# Patient Record
Sex: Male | Born: 1990 | Race: White | Hispanic: No | Marital: Married | Smoking: Former smoker
Health system: Southern US, Community
[De-identification: ages and names within clinical notes are randomized; demographics above are authoritative.]

## PROBLEM LIST (undated history)

## (undated) DIAGNOSIS — I499 Cardiac arrhythmia, unspecified: Secondary | ICD-10-CM

## (undated) DIAGNOSIS — R112 Nausea with vomiting, unspecified: Secondary | ICD-10-CM

## (undated) DIAGNOSIS — R55 Syncope and collapse: Secondary | ICD-10-CM

## (undated) DIAGNOSIS — K219 Gastro-esophageal reflux disease without esophagitis: Secondary | ICD-10-CM

## (undated) DIAGNOSIS — F32A Depression, unspecified: Secondary | ICD-10-CM

## (undated) DIAGNOSIS — F1911 Other psychoactive substance abuse, in remission: Secondary | ICD-10-CM

## (undated) HISTORY — PX: WISDOM TOOTH EXTRACTION: SHX21

## (undated) HISTORY — DX: Depression, unspecified: F32.A

## (undated) HISTORY — DX: Other psychoactive substance abuse, in remission: F19.11

## (undated) HISTORY — DX: Gastro-esophageal reflux disease without esophagitis: K21.9

## (undated) HISTORY — DX: Syncope and collapse: R55

## (undated) HISTORY — DX: Nausea with vomiting, unspecified: R11.2

---
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
## 2013-08-12 ENCOUNTER — Ambulatory Visit (INDEPENDENT_AMBULATORY_CARE_PROVIDER_SITE_OTHER): Payer: Medicaid Other | Admitting: General Surgery

## 2014-08-12 ENCOUNTER — Encounter (HOSPITAL_COMMUNITY): Payer: Self-pay | Admitting: Emergency Medicine

## 2014-08-12 ENCOUNTER — Emergency Department (HOSPITAL_COMMUNITY)
Admission: EM | Admit: 2014-08-12 | Discharge: 2014-08-12 | Disposition: A | Discharge status: Home / Self Care | Payer: BC Managed Care – PPO | Attending: Emergency Medicine | Admitting: Emergency Medicine

## 2014-08-12 ENCOUNTER — Emergency Department (HOSPITAL_COMMUNITY): Payer: BC Managed Care – PPO

## 2014-08-12 DIAGNOSIS — R1110 Vomiting, unspecified: Secondary | ICD-10-CM | POA: Diagnosis not present

## 2014-08-12 DIAGNOSIS — R0789 Other chest pain: Secondary | ICD-10-CM

## 2014-08-12 DIAGNOSIS — R079 Chest pain, unspecified: Secondary | ICD-10-CM

## 2014-08-12 LAB — BASIC METABOLIC PANEL
ANION GAP: 15 (ref 5–15)
BUN: 15 mg/dL (ref 6–23)
CHLORIDE: 101 meq/L (ref 96–112)
CO2: 23 meq/L (ref 19–32)
CREATININE: 0.8 mg/dL (ref 0.50–1.35)
Calcium: 9.9 mg/dL (ref 8.4–10.5)
GFR calc non Af Amer: >90 mL/min (ref >90)
Glucose, Bld: 114 mg/dL — ABNORMAL HIGH (ref 70–99)
POTASSIUM: 3.6 meq/L — AB (ref 3.7–5.3)
SODIUM: 139 meq/L (ref 137–147)

## 2014-08-12 LAB — I-STAT TROPONIN, ED: TROPONIN I, POC: 0 ng/mL (ref 0.00–0.08)

## 2014-08-12 LAB — CBC WITH DIFFERENTIAL/PLATELET
BASOS PCT: 0 % (ref 0–1)
Basophils Absolute: 0 10*3/uL (ref 0.0–0.1)
EOS ABS: 0.2 10*3/uL (ref 0.0–0.7)
Eosinophils Relative: 2 % (ref 0–5)
HEMATOCRIT: 46.2 % (ref 39.0–52.0)
HEMOGLOBIN: 15.9 g/dL (ref 13.0–17.0)
LYMPHS ABS: 2.9 10*3/uL (ref 0.7–4.0)
Lymphocytes Relative: 37 % (ref 12–46)
MCH: 29.3 pg (ref 26.0–34.0)
MCHC: 34.4 g/dL (ref 30.0–36.0)
MCV: 85.1 fL (ref 78.0–100.0)
MONO ABS: 0.6 10*3/uL (ref 0.1–1.0)
MONOS PCT: 8 % (ref 3–12)
NEUTROS ABS: 4.2 10*3/uL (ref 1.7–7.7)
Neutrophils Relative %: 53 % (ref 43–77)
Platelets: 213 10*3/uL (ref 150–400)
RBC: 5.43 MIL/uL (ref 4.22–5.81)
RDW: 12.8 % (ref 11.5–15.5)
WBC: 7.8 10*3/uL (ref 4.0–10.5)

## 2014-08-12 MED ORDER — OMEPRAZOLE 40 MG PO CPDR: 40.0000 mg | DELAYED_RELEASE_CAPSULE | Freq: Every day | ORAL | Status: DC

## 2014-08-12 MED ORDER — PROMETHAZINE HCL 25 MG PO TABS: 25.0000 mg | ORAL_TABLET | Freq: Four times a day (QID) | ORAL | Status: DC | PRN

## 2014-08-12 MED ORDER — GI COCKTAIL ~~LOC~~
30.0000 mL | Freq: Once | ORAL | Status: AC
  Administered 2014-08-12: 30 mL via ORAL
  Filled 2014-08-12: qty 30

## 2014-08-12 NOTE — ED Notes (Signed)
Pt c/o generalized CP and N/V with some dark blood per pt starting today

## 2014-08-12 NOTE — ED Provider Notes (Signed)
CSN: [id number]     Arrival date & time 08/12/14  1309 History   First MD Initiated Contact with Patient 08/12/14 1631     Chief Complaint  Patient presents with  . Chest Pain  . Emesis      Patient is a 23 y.o. male presenting with chest pain and vomiting. The history is provided by the patient.  Chest Pain Associated symptoms: vomiting   Emesis  Mr. Gordy LevanWalton presents for evaluation of chest pain and vomiting. In terms of chest pain, this is been a constant pain that tingling on for the last 1-1/2-2 months. It is central and right-sided chest discomfort described as pressure-like. There is no change with activities, movement, eating or drinking. Denies any fevers, cough, shortness of breath, leg pain or leg swelling. He also describes vomiting that started at 7:30 this morning. He's had about 10 episodes of emesis that have been brown and follow me with sometimes red and he states there is some blood in his vomit. The abdominal pain, diarrhea, black or bloody stools. He does report that he's had intermittent nosebleeds over the last couple weeks, the last one was a few days ago. He takes BC powders and Goody powders over-the-counter occasionally he denies any other medications. Symptoms are moderate in nature.   History reviewed. No pertinent past medical history. History reviewed. No pertinent past surgical history. History reviewed. No pertinent family history. History  Substance Use Topics  . Smoking status: Never Smoker   . Smokeless tobacco: Not on file  . Alcohol Use: No    Review of Systems  Cardiovascular: Positive for chest pain.  Gastrointestinal: Positive for vomiting.  All other systems reviewed and are negative.     Allergies  Review of patient's allergies indicates no known allergies.  Home Medications   Prior to Admission medications   Not on File   BP 121/68 mmHg  Pulse 66  Temp(Src) 97.5 F (36.4 C) (Oral)  Resp 13  Ht 5\' 7"  (1.702 m)  Wt 138 lb  (62.596 kg)  BMI 21.61 kg/m2  SpO2 99% Physical Exam  Constitutional: He is oriented to person, place, and time. He appears well-developed and well-nourished.  HENT:  Head: Normocephalic and atraumatic.  Nose: Nose normal.  Mouth/Throat: Oropharynx is clear and moist.  Cardiovascular: Normal rate.   No murmur heard. Pulmonary/Chest: Effort normal. No respiratory distress.  Abdominal: Soft. There is no tenderness. There is no rebound and no guarding.  Musculoskeletal: He exhibits no edema or tenderness.  Neurological: He is alert and oriented to person, place, and time.  Skin: Skin is warm and dry.  Psychiatric: He has a normal mood and affect. His behavior is normal.  Nursing note and vitals reviewed.   ED Course  Procedures (including critical care time) Labs Review Labs Reviewed  BASIC METABOLIC PANEL - Abnormal; Notable for the following:    Potassium 3.6 (*)    Glucose, Bld 114 (*)    All other components within normal limits  CBC WITH DIFFERENTIAL  Rosezena SensorI-STAT TROPOININ, ED    Imaging Review Dg Chest 2 View  08/12/2014   CLINICAL DATA:  Generalized chest pain, nausea and vomiting  EXAM: CHEST  2 VIEW  COMPARISON:  None.  FINDINGS: The heart size and mediastinal contours are within normal limits. Both lungs are clear. The visualized skeletal structures are unremarkable.  IMPRESSION: No active cardiopulmonary disease.   Electronically Signed   By: Natasha MeadLiviu  Pop M.D.   On: 08/12/2014 15:36  EKG Interpretation   Date/Time:  Wednesday August 12 2014 13:13:08 EST Ventricular Rate:  75 PR Interval:  158 QRS Duration: 100 QT Interval:  384 QTC Calculation: 428 R Axis:   88 Text Interpretation:  Normal sinus rhythm Normal ECG Confirmed by Lincoln Brighamees,  Liz [phone]([id number]) on 08/12/2014 4:32:03 PM      MDM   Final diagnoses:  Vomiting in adult  Atypical chest pain    Mr. Gordy LevanWalton presents for evaluation of vomiting and chest pain.  In terms of chest pain this is atypical pain in  nature, clinical picture is not consistent with ACS, PE, pneumonia, dissection. This patient home care and PCP follow-up regarding chest pain. In terms of vomiting blood, patient is hemodynamically stable in the emergency department and has noted the recurrent vomiting in the department. There is a question if this blood is coming from epistaxis versus GI source. There is no current evidence of bleeding in the emergency department. Discussed with patient stopping BC powders and Goody powders and starting on PPI. Discussed with patient PCP follow-up, return precautions.  Tilden FossaElizabeth Daylan Juhnke, MD 08/12/14 [id number]

## 2014-08-12 NOTE — Discharge Instructions (Signed)
Hematemesis This condition is the vomiting of blood. CAUSES  This can happen if you have a peptic ulcer or an irritation of the throat, stomach, or small bowel. Vomiting over and over again or swallowing blood from a nosebleed, coughing or facial injury can also result in bloody vomit. Anti-inflammatory pain medicines are a common cause of this potentially dangerous condition. The most serious causes of vomiting blood include:  Ulcers (a bacteria called H. pylori is common cause of ulcers).  Clotting problems.  Alcoholism.  Cirrhosis. TREATMENT  Treatment depends on the cause and the severity of the bleeding. Small amounts of blood streaks in the vomit is not the same as vomiting large amounts of bloody or dark, coffee grounds-like material. Weakness, fainting, dehydration, anemia, and continued alcohol or drug use increase the risk. Examination may include blood, vomit, or stool tests. The presence of bloody or dark stool that tests positive for blood (Hemoccult) means the bleeding has been going on for some time. Endoscopy and imaging studies may be done. Emergency treatment may include:  IV medicines or fluids.  Blood transfusions.  Surgery. Hospital care is required for high risk patients or when IV fluids or blood is needed. Upper GI bleeding can cause shock and death if not controlled. HOME CARE INSTRUCTIONS   Your treatment does not require hospital care at this time.  Remain at rest until your condition improves.  Drink clear liquids as tolerated.  Avoid:  Alcohol.  Nicotine.  Aspirin.  Any other anti-inflammatory medicine (ibuprofen, naproxen, and many others).  Medications to suppress stomach acid or vomiting may be needed. Take all your medicine as prescribed.  Be sure to see your caregiver for follow-up as recommended. SEEK IMMEDIATE MEDICAL CARE IF:   You have repeated vomiting, dehydration, fainting, or extreme weakness.  You are vomiting large amounts of  bloody or dark material.  You pass large, dark or bloody stools. Document Released: 09/21/2004 Document Revised: 11/06/2011 Document Reviewed: 10/07/2008 [facility] Patient Information 2015 [place], [place]. This information is not intended to replace advice given to you by your health care provider. Make sure you discuss any questions you have with your health care provider.  Chest Pain (Nonspecific) It is often hard to give a specific diagnosis for the cause of chest pain. There is always a chance that your pain could be related to something serious, such as a heart attack or a blood clot in the lungs. You need to follow up with your health care provider for further evaluation. CAUSES   Heartburn.  Pneumonia or bronchitis.  Anxiety or stress.  Inflammation around your heart (pericarditis) or lung (pleuritis or pleurisy).  A blood clot in the lung.  A collapsed lung (pneumothorax). It can develop suddenly on its own (spontaneous pneumothorax) or from trauma to the chest.  Shingles infection (herpes zoster virus). The chest wall is composed of bones, muscles, and cartilage. Any of these can be the source of the pain.  The bones can be bruised by injury.  The muscles or cartilage can be strained by coughing or overwork.  The cartilage can be affected by inflammation and become sore (costochondritis). DIAGNOSIS  Lab tests or other studies may be needed to find the cause of your pain. Your health care provider may have you take a test called an ambulatory electrocardiogram (ECG). An ECG records your heartbeat patterns over a 24-hour period. You may also have other tests, such as:  Transthoracic echocardiogram (TTE). During echocardiography, sound waves are used to evaluate  how blood flows through your heart.  Transesophageal echocardiogram (TEE).  Cardiac monitoring. This allows your health care provider to monitor your heart rate and rhythm in real time.  Holter monitor. This is a  portable device that records your heartbeat and can help diagnose heart arrhythmias. It allows your health care provider to track your heart activity for several days, if needed.  Stress tests by exercise or by giving medicine that makes the heart beat faster. TREATMENT   Treatment depends on what may be causing your chest pain. Treatment may include:  Acid blockers for heartburn.  Anti-inflammatory medicine.  Pain medicine for inflammatory conditions.  Antibiotics if an infection is present.  You may be advised to change lifestyle habits. This includes stopping smoking and avoiding alcohol, caffeine, and chocolate.  You may be advised to keep your head raised (elevated) when sleeping. This reduces the chance of acid going backward from your stomach into your esophagus. Most of the time, nonspecific chest pain will improve within 2-3 days with rest and mild pain medicine.  HOME CARE INSTRUCTIONS   If antibiotics were prescribed, take them as directed. Finish them even if you start to feel better.  For the next few days, avoid physical activities that bring on chest pain. Continue physical activities as directed.  Do not use any tobacco products, including cigarettes, chewing tobacco, or electronic cigarettes.  Avoid drinking alcohol.  Only take medicine as directed by your health care provider.  Follow your health care provider's suggestions for further testing if your chest pain does not go away.  Keep any follow-up appointments you made. If you do not go to an appointment, you could develop lasting (chronic) problems with pain. If there is any problem keeping an appointment, call to reschedule. SEEK MEDICAL CARE IF:   Your chest pain does not go away, even after treatment.  You have a rash with blisters on your chest.  You have a fever. SEEK IMMEDIATE MEDICAL CARE IF:   You have increased chest pain or pain that spreads to your arm, neck, jaw, back, or abdomen.  You  have shortness of breath.  You have an increasing cough, or you cough up blood.  You have severe back or abdominal pain.  You feel nauseous or vomit.  You have severe weakness.  You faint.  You have chills. This is an emergency. Do not wait to see if the pain will go away. Get medical help at once. Call your local emergency services (911 in [place] Do not drive yourself to the hospital. MAKE SURE YOU:   Understand these instructions.  Will watch your condition.  Will get help right away if you are not doing well or get worse. Document Released: 05/24/2005 Document Revised: 08/19/2013 Document Reviewed: 03/19/2008 [facility] Patient Information 2015 [place], [place]. This information is not intended to replace advice given to you by your health care provider. Make sure you discuss any questions you have with your health care provider.

## 2014-09-07 ENCOUNTER — Ambulatory Visit (INDEPENDENT_AMBULATORY_CARE_PROVIDER_SITE_OTHER): Payer: BLUE CROSS/BLUE SHIELD | Admitting: Physician Assistant

## 2014-09-07 ENCOUNTER — Encounter: Payer: Self-pay | Admitting: Physician Assistant

## 2014-09-07 ENCOUNTER — Ambulatory Visit (HOSPITAL_BASED_OUTPATIENT_CLINIC_OR_DEPARTMENT_OTHER)
Admission: RE | Admit: 2014-09-07 | Discharge: 2014-09-07 | Disposition: A | Discharge status: Home / Self Care | Payer: BLUE CROSS/BLUE SHIELD | Source: Ambulatory Visit | Attending: Physician Assistant | Admitting: Physician Assistant

## 2014-09-07 ENCOUNTER — Other Ambulatory Visit: Payer: Self-pay | Admitting: Physician Assistant

## 2014-09-07 VITALS — BP 130/80 | HR 64 | Temp 97.8°F | Resp 16 | Ht 67.0 in | Wt 138.2 lb

## 2014-09-07 DIAGNOSIS — R1011 Right upper quadrant pain: Secondary | ICD-10-CM | POA: Insufficient documentation

## 2014-09-07 DIAGNOSIS — R112 Nausea with vomiting, unspecified: Secondary | ICD-10-CM | POA: Insufficient documentation

## 2014-09-07 DIAGNOSIS — K2970 Gastritis, unspecified, without bleeding: Secondary | ICD-10-CM

## 2014-09-07 DIAGNOSIS — R1013 Epigastric pain: Secondary | ICD-10-CM | POA: Diagnosis present

## 2014-09-07 HISTORY — DX: Gastritis, unspecified, without bleeding: K29.70

## 2014-09-07 LAB — CBC
HEMATOCRIT: 48.9 % (ref 39.0–52.0)
HEMOGLOBIN: 16.4 g/dL (ref 13.0–17.0)
MCHC: 33.5 g/dL (ref 30.0–36.0)
MCV: 87.6 fl (ref 78.0–100.0)
PLATELETS: 180 10*3/uL (ref 150.0–400.0)
RBC: 5.59 Mil/uL (ref 4.22–5.81)
RDW: 13.1 % (ref 11.5–15.5)
WBC: 8.1 10*3/uL (ref 4.0–10.5)

## 2014-09-07 LAB — COMPREHENSIVE METABOLIC PANEL
ALK PHOS: 71 U/L (ref 39–117)
ALT: 19 U/L (ref 0–53)
AST: 19 U/L (ref 0–37)
Albumin: 4.9 g/dL (ref 3.5–5.2)
BILIRUBIN TOTAL: 0.6 mg/dL (ref 0.2–1.2)
BUN: 16 mg/dL (ref 6–23)
CALCIUM: 9.7 mg/dL (ref 8.4–10.5)
CO2: 25 mEq/L (ref 19–32)
Chloride: 105 mEq/L (ref 96–112)
Creatinine, Ser: 1 mg/dL (ref 0.4–1.5)
GFR: 101.77 mL/min (ref >60.00)
Glucose, Bld: 98 mg/dL (ref 70–99)
Potassium: 4.2 mEq/L (ref 3.5–5.1)
SODIUM: 138 meq/L (ref 135–145)
TOTAL PROTEIN: 7.7 g/dL (ref 6.0–8.3)

## 2014-09-07 LAB — LIPASE: Lipase: 31 U/L (ref 11.0–59.0)

## 2014-09-07 LAB — H. PYLORI ANTIBODY, IGG: H PYLORI IGG: NEGATIVE

## 2014-09-07 MED ORDER — SUCRALFATE 1 GM/10ML PO SUSP: 1.0000 g | Freq: Three times a day (TID) | ORAL | Status: DC

## 2014-09-07 MED ORDER — ESOMEPRAZOLE MAGNESIUM 20 MG PO CPDR: 20.0000 mg | DELAYED_RELEASE_CAPSULE | Freq: Two times a day (BID) | ORAL | Status: DC

## 2014-09-07 NOTE — Assessment & Plan Note (Signed)
US and labs unremarkable.  Gastritis currently treated. Referral placed to GI for further assessment.

## 2014-09-07 NOTE — Progress Notes (Signed)
Patient presents to clinic today to establish care.  Patient complains of intermittent nausea and vomiting associated with bilateral upper quadrant abdominal pain. Was seen in ER and placed on Prilosec daily without improvement in symptoms.  EKG, CXR and labs looked good at that time. Only CBC and BMP checked. Patient states he wen to an urgent care last week and was told his symptoms may be stemming from gallbladder issues. Was told to stop Prilosec and begin Zantac 150 mg daily.  Denies improvement in symptoms.  Patient endorses bilious emesis, having a few episodes per week.  Denies dysphagia, chest pain or shortness of breath.  Pain is not affected by eating.  Pain is constant with occasional exacerbation.  Endorses regular stools.  Denies tenesmus, melena or hematochezia.  Does have remote history of BC powder use but has stopped for > 4 weeks.  Denies alcohol consumption.  Endorses family history of gallbladder problems.  Has not taken anything for nausea.   Past Medical History  Diagnosis Date  . GERD (gastroesophageal reflux disease)   . Nausea & vomiting     Past Surgical History  Procedure Laterality Date  . Wisdom tooth extraction      Current Outpatient Prescriptions on File Prior to Visit  Medication Sig Dispense Refill  . promethazine (PHENERGAN) 25 MG tablet Take 1 tablet (25 mg total) by mouth every 6 (six) hours as needed for nausea or vomiting. 8 tablet 0   No current facility-administered medications on file prior to visit.    Allergies  Allergen Reactions  . Doxycycline Diarrhea, Nausea And Vomiting and Rash    Family History  Problem Relation Age of Onset  . Healthy Mother     Living  . Healthy Mother     Living  . Cancer Maternal Grandfather   . COPD Maternal Grandfather   . Heart disease Maternal Grandfather   . Parkinson's disease Maternal Grandmother   . Gallbladder disease Other     Maternal Great Uncle-Cancer  . Arthritis Maternal Uncle     Hip  Replacement  . Diabetes Sister     History   Social History  . Marital Status: Single    Spouse Name: N/A    Number of Children: N/A  . Years of Education: N/A   Occupational History  . Not on file.   Social History Main Topics  . Smoking status: Never Smoker   . Smokeless tobacco: Not on file  . Alcohol Use: No  . Drug Use: No  . Sexual Activity: Not on file   Other Topics Concern  . Not on file   Social History Narrative   ROS Pertinent ROS are listed in HPI.  BP 130/80 mmHg  Pulse 64  Temp(Src) 97.8 F (36.6 C) (Oral)  Resp 16  Ht 5' 7"  (1.702 m)  Wt 138 lb 4 oz (62.71 kg)  BMI 21.65 kg/m2  SpO2 99%  Physical Exam  Constitutional: He is oriented to person, place, and time and well-developed, well-nourished, and in no distress.  HENT:  Head: Normocephalic and atraumatic.  Eyes: Conjunctivae are normal. Pupils are equal, round, and reactive to light.  Neck: Neck supple.  Cardiovascular: Normal rate, regular rhythm, normal heart sounds and intact distal pulses.   Pulmonary/Chest: Effort normal and breath sounds normal. No respiratory distress. He has no wheezes. He has no rales. He exhibits no tenderness.  Abdominal: Normal appearance and bowel sounds are normal. There is no hepatosplenomegaly. There is tenderness in the right  upper quadrant, epigastric area and left upper quadrant. There is no rebound, no guarding, no CVA tenderness and negative Murphy's sign. No hernia.  Neurological: He is alert and oriented to person, place, and time.  Skin: Skin is warm and dry. No rash noted.  Vitals reviewed.   Recent Results (from the past 2160 hour(s))  Basic metabolic panel     Status: Abnormal   Collection Time: 08/12/14  1:25 PM  Result Value Ref Range   Sodium 139 137 - 147 mEq/L   Potassium 3.6 (L) 3.7 - 5.3 mEq/L   Chloride 101 96 - 112 mEq/L   CO2 23 19 - 32 mEq/L   Glucose, Bld 114 (H) 70 - 99 mg/dL   BUN 15 6 - 23 mg/dL   Creatinine, Ser 0.80 0.50 -  1.35 mg/dL   Calcium 9.9 8.4 - 10.5 mg/dL   GFR calc non Af Amer >90 >90 mL/min   GFR calc Af Amer >90 >90 mL/min    Comment: (NOTE) The eGFR has been calculated using the CKD EPI equation. This calculation has not been validated in all clinical situations. eGFR's persistently <90 mL/min signify possible Chronic Kidney Disease.    Anion gap 15 5 - 15  CBC with Differential     Status: None   Collection Time: 08/12/14  1:25 PM  Result Value Ref Range   WBC 7.8 4.0 - 10.5 K/uL   RBC 5.43 4.22 - 5.81 MIL/uL   Hemoglobin 15.9 13.0 - 17.0 g/dL   HCT 46.2 39.0 - 52.0 %   MCV 85.1 78.0 - 100.0 fL   MCH 29.3 26.0 - 34.0 pg   MCHC 34.4 30.0 - 36.0 g/dL   RDW 12.8 11.5 - 15.5 %   Platelets 213 150 - 400 K/uL   Neutrophils Relative % 53 43 - 77 %   Neutro Abs 4.2 1.7 - 7.7 K/uL   Lymphocytes Relative 37 12 - 46 %   Lymphs Abs 2.9 0.7 - 4.0 K/uL   Monocytes Relative 8 3 - 12 %   Monocytes Absolute 0.6 0.1 - 1.0 K/uL   Eosinophils Relative 2 0 - 5 %   Eosinophils Absolute 0.2 0.0 - 0.7 K/uL   Basophils Relative 0 0 - 1 %   Basophils Absolute 0.0 0.0 - 0.1 K/uL  I-stat troponin, ED (not at [facility])     Status: None   Collection Time: 08/12/14  1:33 PM  Result Value Ref Range   Troponin i, poc 0.00 0.00 - 0.08 ng/mL   Comment 3            Comment: Due to the release kinetics of cTnI, a negative result within the first hours of the onset of symptoms does not rule out myocardial infarction with certainty. If myocardial infarction is still suspected, repeat the test at appropriate intervals.   CBC     Status: None   Collection Time: 09/07/14  8:51 AM  Result Value Ref Range   WBC 8.1 4.0 - 10.5 K/uL   RBC 5.59 4.22 - 5.81 Mil/uL   Platelets 180.0 150.0 - 400.0 K/uL   Hemoglobin 16.4 13.0 - 17.0 g/dL   HCT 48.9 39.0 - 52.0 %   MCV 87.6 78.0 - 100.0 fl   MCHC 33.5 30.0 - 36.0 g/dL   RDW 13.1 11.5 - 15.5 %  Comp Met (CMET)     Status: None   Collection Time: 09/07/14  8:51 AM  Result  Value Ref Range  Sodium 138 135 - 145 mEq/L   Potassium 4.2 3.5 - 5.1 mEq/L   Chloride 105 96 - 112 mEq/L   CO2 25 19 - 32 mEq/L   Glucose, Bld 98 70 - 99 mg/dL   BUN 16 6 - 23 mg/dL   Creatinine, Ser 1.0 0.4 - 1.5 mg/dL   Total Bilirubin 0.6 0.2 - 1.2 mg/dL   Alkaline Phosphatase 71 39 - 117 U/L   AST 19 0 - 37 U/L   ALT 19 0 - 53 U/L   Total Protein 7.7 6.0 - 8.3 g/dL   Albumin 4.9 3.5 - 5.2 g/dL   Calcium 9.7 8.4 - 10.5 mg/dL   GFR 101.77 >60.00 mL/min  Lipase     Status: None   Collection Time: 09/07/14  8:51 AM  Result Value Ref Range   Lipase 31.0 11.0 - 59.0 U/L  H. pylori antibody, IgG     Status: None   Collection Time: 09/07/14  8:51 AM  Result Value Ref Range   H Pylori IgG Negative Negative    Assessment/Plan: Gastritis Labs and US unremarkable. Rx Nexium 20 mg BID. Rx Carafate with meals. Referral to GI placed.  RUQ pain [place] and labs unremarkable.  Gastritis currently treated. Referral placed to GI for further assessment.

## 2014-09-07 NOTE — Patient Instructions (Signed)
Please take the Nexium twice daily as directed.  Increase fluids.  Avoid spicy or heavy foods.  Stop by the lab for blood work. I will call you with your results. Please go downstairs for ultrasound.  The appointment is at 9:30 but arrive 15 minutes early.  No food or water before imaging.

## 2014-09-07 NOTE — Assessment & Plan Note (Signed)
Labs and US unremarkable. Rx Nexium 20 mg BID. Rx Carafate with meals. Referral to GI placed.

## 2014-09-07 NOTE — Progress Notes (Signed)
Pre visit review using our clinic review tool, if applicable. No additional management support is needed unless otherwise documented below in the visit note/SLS  

## 2014-09-09 ENCOUNTER — Telehealth: Payer: Self-pay | Admitting: *Deleted

## 2014-09-09 NOTE — Telephone Encounter (Signed)
Prior authorization initiated for esomeprazole magnesium 20 mg DR capsules through [employer]. Awaiting determination. [id number]//[id number]

## 2015-01-09 ENCOUNTER — Emergency Department (HOSPITAL_BASED_OUTPATIENT_CLINIC_OR_DEPARTMENT_OTHER): Payer: BLUE CROSS/BLUE SHIELD

## 2015-01-09 ENCOUNTER — Emergency Department (HOSPITAL_BASED_OUTPATIENT_CLINIC_OR_DEPARTMENT_OTHER)
Admission: EM | Admit: 2015-01-09 | Discharge: 2015-01-09 | Disposition: A | Discharge status: Home / Self Care | Payer: BLUE CROSS/BLUE SHIELD | Attending: Emergency Medicine | Admitting: Emergency Medicine

## 2015-01-09 ENCOUNTER — Encounter (HOSPITAL_BASED_OUTPATIENT_CLINIC_OR_DEPARTMENT_OTHER): Payer: Self-pay

## 2015-01-09 DIAGNOSIS — R112 Nausea with vomiting, unspecified: Secondary | ICD-10-CM | POA: Insufficient documentation

## 2015-01-09 DIAGNOSIS — K219 Gastro-esophageal reflux disease without esophagitis: Secondary | ICD-10-CM | POA: Insufficient documentation

## 2015-01-09 DIAGNOSIS — R1011 Right upper quadrant pain: Secondary | ICD-10-CM | POA: Insufficient documentation

## 2015-01-09 LAB — COMPREHENSIVE METABOLIC PANEL
ALBUMIN: 4.7 g/dL (ref 3.5–5.0)
ALT: 19 U/L (ref 17–63)
AST: 19 U/L (ref 15–41)
Alkaline Phosphatase: 78 U/L (ref 38–126)
Anion gap: 10 (ref 5–15)
BILIRUBIN TOTAL: 1.3 mg/dL — AB (ref 0.3–1.2)
BUN: 28 mg/dL — AB (ref 6–20)
CALCIUM: 9 mg/dL (ref 8.9–10.3)
CO2: 20 mmol/L — ABNORMAL LOW (ref 22–32)
Chloride: 106 mmol/L (ref 101–111)
Creatinine, Ser: 0.86 mg/dL (ref 0.61–1.24)
Glucose, Bld: 112 mg/dL — ABNORMAL HIGH (ref 65–99)
Potassium: 3.7 mmol/L (ref 3.5–5.1)
Sodium: 136 mmol/L (ref 135–145)
TOTAL PROTEIN: 7.2 g/dL (ref 6.5–8.1)

## 2015-01-09 LAB — CBC WITH DIFFERENTIAL/PLATELET
BASOS ABS: 0 10*3/uL (ref 0.0–0.1)
Basophils Relative: 0 % (ref 0–1)
EOS ABS: 0.2 10*3/uL (ref 0.0–0.7)
Eosinophils Relative: 2 % (ref 0–5)
HCT: 48 % (ref 39.0–52.0)
Hemoglobin: 17 g/dL (ref 13.0–17.0)
Lymphocytes Relative: 4 % — ABNORMAL LOW (ref 12–46)
Lymphs Abs: 0.5 10*3/uL — ABNORMAL LOW (ref 0.7–4.0)
MCH: 30 pg (ref 26.0–34.0)
MCHC: 35.4 g/dL (ref 30.0–36.0)
MCV: 84.7 fL (ref 78.0–100.0)
MONO ABS: 1.1 10*3/uL — AB (ref 0.1–1.0)
Monocytes Relative: 8 % (ref 3–12)
NEUTROS PCT: 86 % — AB (ref 43–77)
Neutro Abs: 11.5 10*3/uL — ABNORMAL HIGH (ref 1.7–7.7)
PLATELETS: 188 10*3/uL (ref 150–400)
RBC: 5.67 MIL/uL (ref 4.22–5.81)
RDW: 12.8 % (ref 11.5–15.5)
WBC: 13.3 10*3/uL — ABNORMAL HIGH (ref 4.0–10.5)

## 2015-01-09 LAB — LIPASE, BLOOD: Lipase: 21 U/L — ABNORMAL LOW (ref 22–51)

## 2015-01-09 LAB — URINALYSIS, ROUTINE W REFLEX MICROSCOPIC
Glucose, UA: NEGATIVE mg/dL
HGB URINE DIPSTICK: NEGATIVE
Leukocytes, UA: NEGATIVE
Nitrite: NEGATIVE
Protein, ur: NEGATIVE mg/dL
SPECIFIC GRAVITY, URINE: 1.033 — AB (ref 1.005–1.030)
UROBILINOGEN UA: 0.2 mg/dL (ref 0.0–1.0)
pH: 5.5 (ref 5.0–8.0)

## 2015-01-09 MED ORDER — KETOROLAC TROMETHAMINE 30 MG/ML IJ SOLN
30.0000 mg | Freq: Once | INTRAMUSCULAR | Status: AC
  Administered 2015-01-09: 30 mg via INTRAVENOUS
  Filled 2015-01-09: qty 1

## 2015-01-09 MED ORDER — OMEPRAZOLE 20 MG PO CPDR: 20.0000 mg | DELAYED_RELEASE_CAPSULE | Freq: Every day | ORAL | Status: DC

## 2015-01-09 MED ORDER — ONDANSETRON 4 MG PO TBDP: ORAL_TABLET | ORAL | Status: DC

## 2015-01-09 NOTE — ED Notes (Signed)
PA at bedside.

## 2015-01-09 NOTE — Discharge Instructions (Signed)
You were evaluated in the ED today for your Right upper quadrant pain. There is not appear to be an emergent cause for your symptoms at this time. Your lab work, exam and ultrasound are reassuring. Please take your medications as directed to help with your symptoms. Follow-up with primary care for further evaluation and management. Return to ED for new or worsening symptoms.  Biliary Colic  Biliary colic is a steady or irregular pain in the upper abdomen. It is usually under the right side of the rib cage. It happens when gallstones interfere with the normal flow of bile from the gallbladder. Bile is a liquid that helps to digest fats. Bile is made in the liver and stored in the gallbladder. When you eat a meal, bile passes from the gallbladder through the cystic duct and the common bile duct into the small intestine. There, it mixes with partially digested food. If a gallstone blocks either of these ducts, the normal flow of bile is blocked. The muscle cells in the bile duct contract forcefully to try to move the stone. This causes the pain of biliary colic.  SYMPTOMS   A person with biliary colic usually complains of pain in the upper abdomen. This pain can be:  In the center of the upper abdomen just below the breastbone.  In the upper-right part of the abdomen, near the gallbladder and liver.  Spread back toward the right shoulder blade.  Nausea and vomiting.  The pain usually occurs after eating.  Biliary colic is usually triggered by the digestive system's demand for bile. The demand for bile is high after fatty meals. Symptoms can also occur when a person who has been fasting suddenly eats a very large meal. Most episodes of biliary colic pass after 1 to 5 hours. After the most intense pain passes, your abdomen may continue to ache mildly for about 24 hours. DIAGNOSIS  After you describe your symptoms, your caregiver will perform a physical exam. He or she will pay attention to the upper  right portion of your belly (abdomen). This is the area of your liver and gallbladder. An ultrasound will help your caregiver look for gallstones. Specialized scans of the gallbladder may also be done. Blood tests may be done, especially if you have fever or if your pain persists. PREVENTION  Biliary colic can be prevented by controlling the risk factors for gallstones. Some of these risk factors, such as heredity, increasing age, and pregnancy are a normal part of life. Obesity and a high-fat diet are risk factors you can change through a healthy lifestyle. Women going through menopause who take hormone replacement therapy (estrogen) are also more likely to develop biliary colic. TREATMENT   Pain medication may be prescribed.  You may be encouraged to eat a fat-free diet.  If the first episode of biliary colic is severe, or episodes of colic keep retuning, surgery to remove the gallbladder (cholecystectomy) is usually recommended. This procedure can be done through small incisions using an instrument called a laparoscope. The procedure often requires a brief stay in the hospital. Some people can leave the hospital the same day. It is the most widely used treatment in people troubled by painful gallstones. It is effective and safe, with no complications in more than 90% of cases.  If surgery cannot be done, medication that dissolves gallstones may be used. This medication is expensive and can take months or years to work. Only small stones will dissolve.  Rarely, medication to dissolve gallstones  is combined with a procedure called shock-wave lithotripsy. This procedure uses carefully aimed shock waves to break up gallstones. In many people treated with this procedure, gallstones form again within a few years. PROGNOSIS  If gallstones block your cystic duct or common bile duct, you are at risk for repeated episodes of biliary colic. There is also a 25% chance that you will develop a gallbladder  infection(acute cholecystitis), or some other complication of gallstones within 10 to 20 years. If you have surgery, schedule it at a time that is convenient for you and at a time when you are not sick. HOME CARE INSTRUCTIONS   Drink plenty of clear fluids.  Avoid fatty, greasy or fried foods, or any foods that make your pain worse.  Take medications as directed. SEEK MEDICAL CARE IF:   You develop a fever over 100.5 F (38.1 C).  Your pain gets worse over time.  You develop nausea that prevents you from eating and drinking.  You develop vomiting. SEEK IMMEDIATE MEDICAL CARE IF:   You have continuous or severe belly (abdominal) pain which is not relieved with medications.  You develop nausea and vomiting which is not relieved with medications.  You have symptoms of biliary colic and you suddenly develop a fever and shaking chills. This may signal cholecystitis. Call your caregiver immediately.  You develop a yellow color to your skin or the white part of your eyes (jaundice). Document Released: 01/15/2006 Document Revised: 11/06/2011 Document Reviewed: 03/26/2008 [facility] Patient Information 2015 [employer], [place]. This information is not intended to replace advice given to you by your health care provider. Make sure you discuss any questions you have with your health care provider.

## 2015-01-09 NOTE — ED Notes (Signed)
Patient here with recurrent nausea and vomiting with RUQ pain for the past few months. Patient reports that the symptoms start after eating greasy or fried foods.

## 2015-01-09 NOTE — ED Provider Notes (Signed)
CSN: [id number]     Arrival date & time 01/09/15  1129 History   First MD Initiated Contact with Patient 01/09/15 1236     Chief Complaint  Patient presents with  . Emesis     (Consider location/radiation/quality/duration/timing/severity/associated sxs/prior Treatment) HPI Wesley Hartman is a 24 y.o. male who comes in for evaluation of right upper quadrant pain. Patient states he has had this pain intermittently for the past 3 or 4 months. This pain is exacerbated with eating greasy foods. He reports most recently, he experienced sudden onset of pain at approximately 6:00 this morning and his right upper quadrant that he characterized as "sharp stabbing pain". He rates it as a 10/10. The pain sometimes radiates into his upper right ribs. He did not do anything to improve his discomfort and reports he is still experiencing the same discomfort now and has not improved over the day. Reports he only rarely drinks alcohol and only has "a sip of beer". He reports associated nausea with bilious vomit this morning, nonbloody. He denies fevers, urinary symptoms, dark or bloody stools.  Past Medical History  Diagnosis Date  . GERD (gastroesophageal reflux disease)   . Nausea & vomiting    Past Surgical History  Procedure Laterality Date  . Wisdom tooth extraction     Family History  Problem Relation Age of Onset  . Healthy Mother     Living  . Healthy Mother     Living  . Cancer Maternal Grandfather   . COPD Maternal Grandfather   . Heart disease Maternal Grandfather   . Parkinson's disease Maternal Grandmother   . Gallbladder disease Other     Maternal Great Uncle-Cancer  . Arthritis Maternal Uncle     Hip Replacement  . Diabetes Sister    History  Substance Use Topics  . Smoking status: Never Smoker   . Smokeless tobacco: Not on file  . Alcohol Use: No    Review of Systems A 10 point review of systems was completed and was negative except for pertinent positives and negatives as  mentioned in the history of present illness     Allergies  Doxycycline  Home Medications   Prior to Admission medications   Medication Sig Start Date End Date Taking? Authorizing Provider  omeprazole (PRILOSEC) 20 MG capsule Take 1 capsule (20 mg total) by mouth daily. 01/09/15   Joycie PeekBenjamin Shree Espey, PA-C  ondansetron (ZOFRAN ODT) 4 MG disintegrating tablet 4mg  ODT q4 hours prn nausea/vomit 01/09/15   Dennis Hegeman, PA-C   BP 127/63 mmHg  Pulse 75  Temp(Src) 98.3 F (36.8 C) (Oral)  Resp 18  SpO2 97% Physical Exam  Constitutional: He is oriented to person, place, and time. He appears well-developed and well-nourished.  HENT:  Head: Normocephalic and atraumatic.  Mouth/Throat: Oropharynx is clear and moist.  Eyes: Conjunctivae are normal. Pupils are equal, round, and reactive to light. Right eye exhibits no discharge. Left eye exhibits no discharge. No scleral icterus.  Neck: Neck supple.  Cardiovascular: Normal rate, regular rhythm and normal heart sounds.   Pulmonary/Chest: Effort normal and breath sounds normal. No respiratory distress. He has no wheezes. He has no rales.  Abdominal: Soft.  Abdomen soft, nondistended. Tenderness in the right upper quadrant. Positive Murphy sign. No rebound or guarding. Negative McBurney's, Rovsing's. No other lesions or deformities noted.  Musculoskeletal: He exhibits no tenderness.  Neurological: He is alert and oriented to person, place, and time.  Cranial Nerves II-XII grossly intact  Skin: Skin is  warm and dry. No rash noted.  Psychiatric: He has a normal mood and affect.  Nursing note and vitals reviewed.   ED Course  Procedures (including critical care time) Labs Review Labs Reviewed  COMPREHENSIVE METABOLIC PANEL - Abnormal; Notable for the following:    CO2 20 (*)    Glucose, Bld 112 (*)    BUN 28 (*)    Total Bilirubin 1.3 (*)    All other components within normal limits  LIPASE, BLOOD - Abnormal; Notable for the following:     Lipase 21 (*)    All other components within normal limits  CBC WITH DIFFERENTIAL/PLATELET - Abnormal; Notable for the following:    WBC 13.3 (*)    Neutrophils Relative % 86 (*)    Neutro Abs 11.5 (*)    Lymphocytes Relative 4 (*)    Lymphs Abs 0.5 (*)    Monocytes Absolute 1.1 (*)    All other components within normal limits  URINALYSIS, ROUTINE W REFLEX MICROSCOPIC - Abnormal; Notable for the following:    Specific Gravity, Urine 1.033 (*)    Bilirubin Urine SMALL (*)    Ketones, ur >80 (*)    All other components within normal limits    Imaging Review [place] Abdomen Complete  01/09/2015   CLINICAL DATA:  Seven right upper quadrant abdomen pain with nausea and vomiting.  EXAM: ULTRASOUND ABDOMEN COMPLETE  COMPARISON:  None.  FINDINGS: Gallbladder: No gallstones or wall thickening visualized. No sonographic Murphy sign noted.  Common bile duct: Diameter: 4 mm  Liver: No focal lesion identified. Within normal limits in parenchymal echogenicity.  IVC: No abnormality visualized.  Pancreas: Visualized portion unremarkable.  Spleen: Size and appearance within normal limits.  Right Kidney: Length: 11.1 cm. Echogenicity within normal limits. No mass or hydronephrosis visualized.  Left Kidney: Length: 11.1 cm. Echogenicity within normal limits. No mass or hydronephrosis visualized.  Abdominal aorta: No aneurysm visualized.  Other findings: None.  IMPRESSION: Normal gallbladder.  No acute abnormality.   Electronically Signed   By: Sherian ReinWei-Chen  Lin M.D.   On: 01/09/2015 13:18     EKG Interpretation None     Meds given in ED:  Medications  ketorolac (TORADOL) 30 MG/ML injection 30 mg (30 mg Intravenous Given 01/09/15 1428)    Discharge Medication List as of 01/09/2015  3:14 PM    START taking these medications   Details  omeprazole (PRILOSEC) 20 MG capsule Take 1 capsule (20 mg total) by mouth daily., Starting 01/09/2015, Until Discontinued, Print    ondansetron (ZOFRAN ODT) 4 MG disintegrating  tablet 4mg  ODT q4 hours prn nausea/vomit, Print       Filed Vitals:   01/09/15 1139 01/09/15 1333 01/09/15 1523  BP: 126/90 131/76 127/63  Pulse: 90 70 75  Temp: 97.6 F (36.4 C)  98.3 F (36.8 C)  TempSrc: Oral  Oral  Resp: 18 20 18   SpO2: 99% 98% 97%    MDM  Vitals stable - WNL -afebrile Pt resting comfortably in ED. PE--right upper quadrant tenderness without any other abdominal tenderness. Labwork--labs noncontributory, normal LFTs, lipase 21 Imaging--right upper quadrant ultrasound negative  DDX--patient with likely biliary colic. Doubt appendicitis or other acute intra-abdominal pathology. Discussed continued use of Motrin and Tylenol at home in addition to Zofran for nausea. DC with omeprazole  I discussed all relevant lab findings and imaging results with pt and they verbalized understanding. Discussed f/u with PCP within 48 hrs and return precautions, pt very amenable to plan.  Final  diagnoses:  RUQ pain        Joycie Peek, PA-C 01/11/15 [id number]  Toy Cookey, MD 01/11/15 [phone]

## 2015-01-09 NOTE — ED Notes (Signed)
Pt requesting pain meds, provider notified

## 2015-01-09 NOTE — ED Notes (Signed)
Patient transported to ultrasound with tech.

## 2015-09-29 ENCOUNTER — Encounter (HOSPITAL_BASED_OUTPATIENT_CLINIC_OR_DEPARTMENT_OTHER): Payer: Self-pay | Admitting: *Deleted

## 2015-09-29 ENCOUNTER — Emergency Department (HOSPITAL_BASED_OUTPATIENT_CLINIC_OR_DEPARTMENT_OTHER)
Admission: EM | Admit: 2015-09-29 | Discharge: 2015-09-29 | Disposition: A | Discharge status: Home / Self Care | Payer: Self-pay | Attending: Emergency Medicine | Admitting: Emergency Medicine

## 2015-09-29 DIAGNOSIS — Z79899 Other long term (current) drug therapy: Secondary | ICD-10-CM | POA: Insufficient documentation

## 2015-09-29 DIAGNOSIS — Y9389 Activity, other specified: Secondary | ICD-10-CM | POA: Insufficient documentation

## 2015-09-29 DIAGNOSIS — K219 Gastro-esophageal reflux disease without esophagitis: Secondary | ICD-10-CM | POA: Insufficient documentation

## 2015-09-29 DIAGNOSIS — S39012A Strain of muscle, fascia and tendon of lower back, initial encounter: Secondary | ICD-10-CM | POA: Insufficient documentation

## 2015-09-29 DIAGNOSIS — Z8679 Personal history of other diseases of the circulatory system: Secondary | ICD-10-CM | POA: Insufficient documentation

## 2015-09-29 DIAGNOSIS — Y998 Other external cause status: Secondary | ICD-10-CM | POA: Insufficient documentation

## 2015-09-29 DIAGNOSIS — T148XXA Other injury of unspecified body region, initial encounter: Secondary | ICD-10-CM

## 2015-09-29 DIAGNOSIS — Y92410 Unspecified street and highway as the place of occurrence of the external cause: Secondary | ICD-10-CM | POA: Insufficient documentation

## 2015-09-29 HISTORY — DX: Cardiac arrhythmia, unspecified: I49.9

## 2015-09-29 MED ORDER — METHOCARBAMOL 500 MG PO TABS
1000.0000 mg | ORAL_TABLET | Freq: Once | ORAL | Status: AC
  Administered 2015-09-29: 1000 mg via ORAL
  Filled 2015-09-29: qty 2

## 2015-09-29 MED ORDER — METHOCARBAMOL 500 MG PO TABS: 1000.0000 mg | ORAL_TABLET | Freq: Four times a day (QID) | ORAL | Status: DC | PRN

## 2015-09-29 MED ORDER — KETOROLAC TROMETHAMINE 60 MG/2ML IM SOLN
60.0000 mg | Freq: Once | INTRAMUSCULAR | Status: AC
  Administered 2015-09-29: 60 mg via INTRAMUSCULAR
  Filled 2015-09-29: qty 2

## 2015-09-29 NOTE — Discharge Instructions (Signed)

## 2015-09-29 NOTE — ED Notes (Signed)
MD at bedside. 
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
## 2018-03-02 ENCOUNTER — Emergency Department (HOSPITAL_BASED_OUTPATIENT_CLINIC_OR_DEPARTMENT_OTHER)
Admission: EM | Admit: 2018-03-02 | Discharge: 2018-03-02 | Disposition: A | Discharge status: Home / Self Care | Payer: BLUE CROSS/BLUE SHIELD | Attending: Emergency Medicine | Admitting: Emergency Medicine

## 2018-03-02 ENCOUNTER — Other Ambulatory Visit: Payer: Self-pay

## 2018-03-02 ENCOUNTER — Encounter (HOSPITAL_BASED_OUTPATIENT_CLINIC_OR_DEPARTMENT_OTHER): Payer: Self-pay | Admitting: Emergency Medicine

## 2018-03-02 ENCOUNTER — Emergency Department (HOSPITAL_BASED_OUTPATIENT_CLINIC_OR_DEPARTMENT_OTHER): Payer: BLUE CROSS/BLUE SHIELD

## 2018-03-02 DIAGNOSIS — S61212A Laceration without foreign body of right middle finger without damage to nail, initial encounter: Secondary | ICD-10-CM

## 2018-03-02 DIAGNOSIS — Y939 Activity, unspecified: Secondary | ICD-10-CM | POA: Insufficient documentation

## 2018-03-02 DIAGNOSIS — Z23 Encounter for immunization: Secondary | ICD-10-CM | POA: Insufficient documentation

## 2018-03-02 DIAGNOSIS — Y929 Unspecified place or not applicable: Secondary | ICD-10-CM | POA: Insufficient documentation

## 2018-03-02 DIAGNOSIS — Y999 Unspecified external cause status: Secondary | ICD-10-CM | POA: Insufficient documentation

## 2018-03-02 DIAGNOSIS — W260XXA Contact with knife, initial encounter: Secondary | ICD-10-CM | POA: Insufficient documentation

## 2018-03-02 MED ORDER — TETANUS-DIPHTH-ACELL PERTUSSIS 5-2.5-18.5 LF-MCG/0.5 IM SUSP
0.5000 mL | Freq: Once | INTRAMUSCULAR | Status: AC
  Administered 2018-03-02: 0.5 mL via INTRAMUSCULAR
  Filled 2018-03-02: qty 0.5

## 2018-03-02 MED ORDER — LIDOCAINE HCL (PF) 1 % IJ SOLN
5.0000 mL | Freq: Once | INTRAMUSCULAR | Status: AC
  Administered 2018-03-02: 5 mL
  Filled 2018-03-02: qty 5

## 2018-03-02 NOTE — ED Triage Notes (Signed)
Patient states that he cut his right middle finger with a pocket knife about 30 minutes ago - bleeding controlled, would well approximated

## 2018-03-02 NOTE — ED Provider Notes (Signed)
[facility] EMERGENCY DEPARTMENT Provider Note   CSN: [id number] Arrival date & time: 03/02/18  1952     History   Chief Complaint Chief Complaint  Patient presents with  . Laceration    HPI Wesley Hartman is a 27 y.o. male with no significant past medical history presents emergency department today for laceration to the third digit of the right hand.  Patient is right-hand dominant.  He notes he was attempting to cut a hole and a plastic bucket with a pocket knife approximately 30 minutes ago when the knife folded and cut the dorsal aspect of his third digit on his right hand just distal to the PIP.  The wound is approximately 2 cm in length.  Bleeding is currently controlled.  No interventions prior to arrival.  He denies any difficulty with range of motion, numbness/tingling or weakness.  He is unsure of his last tetanus shot.  He rates his pain as a 2/10 and describes as throbbing and constant.  HPI  Past Medical History:  Diagnosis Date  . GERD (gastroesophageal reflux disease)   . Irregular heartbeat    benign  . Nausea & vomiting     Patient Active Problem List   Diagnosis Date Noted  . Gastritis 09/07/2014  . RUQ pain 09/07/2014    Past Surgical History:  Procedure Laterality Date  . WISDOM TOOTH EXTRACTION          Home Medications    Prior to Admission medications   Medication Sig Start Date End Date Taking? Authorizing Provider  methocarbamol (ROBAXIN) 500 MG tablet Take 2 tablets (1,000 mg total) by mouth every 6 (six) hours as needed for muscle spasms. 09/29/15   Loren RacerYelverton, David, MD  omeprazole (PRILOSEC) 20 MG capsule Take 1 capsule (20 mg total) by mouth daily. 01/09/15   Joycie Peekartner, Benjamin, PA-C  ondansetron (ZOFRAN ODT) 4 MG disintegrating tablet 4mg  ODT q4 hours prn nausea/vomit 01/09/15   Joycie Peekartner, Benjamin, PA-C    Family History Family History  Problem Relation Age of Onset  . Healthy Mother        Living/Living  . Cancer Maternal  Grandfather   . COPD Maternal Grandfather   . Heart disease Maternal Grandfather   . Parkinson's disease Maternal Grandmother   . Gallbladder disease Other        Maternal Great Uncle-Cancer  . Arthritis Maternal Uncle        Hip Replacement  . Diabetes Sister     Social History Social History   Tobacco Use  . Smoking status: Current Every Day Smoker  . Smokeless tobacco: Never Used  Substance Use Topics  . Alcohol use: No  . Drug use: No     Allergies   Doxycycline   Review of Systems Review of Systems  Constitutional: Negative for fever.  Musculoskeletal: Positive for arthralgias. Negative for joint swelling.  Skin: Positive for wound.  Neurological: Negative for weakness and numbness.     Physical Exam Updated Vital Signs BP 122/72 (BP Location: Left Arm)   Pulse 73   Temp 98.3 F (36.8 C) (Oral)   Resp 18   Ht 5\' 7"  (1.702 m)   Wt 61.2 kg (135 lb)   SpO2 100%   BMI 21.14 kg/m   Physical Exam  Constitutional: He appears well-developed and well-nourished.  HENT:  Head: Normocephalic and atraumatic.  Right Ear: External ear normal.  Left Ear: External ear normal.  Eyes: Conjunctivae are normal. Right eye exhibits no discharge. Left eye  exhibits no discharge. No scleral icterus.  Cardiovascular:  Pulses:      Radial pulses are 2+ on the right side.  Pulmonary/Chest: Effort normal. No respiratory distress.  Musculoskeletal:  Right hand: Distal to the third PIP on the dorsal aspect there is a 2 cm laceration that is well approximated.  Bleeding is currently controlled.  No bony tenderness palpation.  Isolated & resisted range of motion for flexion/extension at MCP, PIP and DIP intact.  FDS/FDP intact.  Sensation intact distal to injury.  Cap refill less than 2 seconds.  Neurological: He is alert. He has normal strength. No sensory deficit.  Skin: Skin is warm and dry. Capillary refill takes less than 2 seconds. Laceration noted. No pallor.  Psychiatric:  He has a normal mood and affect.  Nursing note and vitals reviewed.    ED Treatments / Results  Labs (all labs ordered are listed, but only abnormal results are displayed) Labs Reviewed - No data to display  EKG None  Radiology No results found.  Procedures .Marland KitchenLaceration Repair Date/Time: 03/02/2018 8:55 PM Performed by: Jacinto Halim, PA-C Authorized by: Jacinto Halim, PA-C   Consent:    Consent obtained:  Verbal   Consent given by:  Patient   Risks discussed:  Infection, need for additional repair, nerve damage, poor wound healing, poor cosmetic result, retained foreign body, tendon damage, pain and vascular damage   Alternatives discussed:  No treatment Anesthesia (see MAR for exact dosages):    Anesthesia method:  Nerve block   Block location:  Digital   Block needle gauge:  25 G   Block anesthetic:  Lidocaine 1% w/o epi   Block technique:  Digital block   Block injection procedure:  Anatomic landmarks identified, anatomic landmarks palpated, negative aspiration for blood, introduced needle and incremental injection   Block outcome:  Anesthesia achieved Laceration details:    Location:  Finger   Finger location:  R long finger   Length (cm):  2 Repair type:    Repair type:  Simple Pre-procedure details:    Preparation:  Patient was prepped and draped in usual sterile fashion and imaging obtained to evaluate for foreign bodies Exploration:    Hemostasis achieved with:  Direct pressure   Wound exploration: wound explored through full range of motion and entire depth of wound probed and visualized     Wound extent: no foreign bodies/material noted, no nerve damage noted, no tendon damage noted and no underlying fracture noted     Contaminated: no   Treatment:    Area cleansed with:  Saline   Amount of cleaning:  Standard   Irrigation solution:  Sterile saline   Irrigation volume:  500   Irrigation method:  Syringe   Visualized foreign bodies/material  removed: no   Skin repair:    Repair method:  Sutures   Suture size:  5-0   Wound skin closure material used: Ethilon.   Suture technique:  Simple interrupted   Number of sutures:  3 Approximation:    Approximation:  Close Post-procedure details:    Dressing:  Antibiotic ointment, non-adherent dressing and splint for protection   Patient tolerance of procedure:  Tolerated well, no immediate complications   (including critical care time)  Medications Ordered in ED Medications  Tdap (BOOSTRIX) injection 0.5 mL (has no administration in time range)  lidocaine (PF) (XYLOCAINE) 1 % injection 5 mL (has no administration in time range)     Initial Impression / Assessment and Plan /  ED Course  I have reviewed the triage vital signs and the nursing notes.  Pertinent labs & imaging results that were available during my care of the patient were reviewed by me and considered in my medical decision making (see chart for details).     26 y.o. right hand dominant male who presents to the emergency department today for laceration to the dorsal aspect of his right, third digit, just distal to the PIP.  This occurred 30 minutes prior to arrival.  There is no evidence of foreign body.  No evidence of tendon injury.  He is neurovascular intact.  X-ray without evidence of foreign body or fracture.  Pressure irrigation performed.  Wound was explored through full depth in a bloodless field without evidence of foreign body.  Patient tolerated procedure well.  He was given splint for protection given laceration occurred near joint.  Patient's tetanus is updated.  Patient is without history of immunocompromise laceration occurred less than 8 hours ago.  No indication for antibiotics.  Discussed with patient return precautions.  Patient is a follow-up in 7 days for suture removal.  Patient states understanding is in agreement with plan.  He appears safe for discharge.  Final Clinical Impressions(s) / ED  Diagnoses   Final diagnoses:  Laceration of right middle finger without foreign body without damage to nail, initial encounter    ED Discharge Orders    None       Princella Pellegrini 03/02/18 2106    Tilden Fossa, MD 03/03/18 1547

## 2018-03-02 NOTE — Discharge Instructions (Signed)
Please read and follow all provided instructions.  Your diagnoses today is a laceration. A laceration is a cut or lesion that goes through all layers of the skin and into the tissue just beneath the skin. This was repaired with 3 stitches or a tissue adhesive similar to a super glue.  Follow up with your doctor, an urgent care, or this Emergency Department for removal of your stitches in 7-10 days. Keep the wound clean and dry for the next 24 hours and leave the dressing in place. You may shower after 24 hours. Do not soak the area for long periods of times as in a bath until the sutures are removed. After 24 hours you may remove the dressing and gently clean the laceration site with antibacterial soap (i.e. Neosporin or Bacitracin) and warm water 2 times a day. Pat dry with clean towel. Do not scrub. Once the wound has healed, scarring can be minimized by covering the wound with sunscreen during the day for 1 full year.  Return instructions:  You have redness, swelling, or increasing pain in the wound.  You see a red line that goes away from the wound.  You have yellowish-white fluid (pus) coming from the wound.  You have a fever (above 100.74F) You notice a bad smell coming from the wound or dressing.  Your wound breaks open before or after sutures have been removed.  You notice something coming out of the wound such as wood or glass.  Your wound is on your hand or foot and you cannot move a finger or toe.  Your pain is not controlled with prescribed medicine.     Additional Information:  If you did not receive a tetanus shot today because you thought you were up to date, but did not recall when your last one was given, make sure to check with your primary caregiver to determine if you need one.   Your vital signs today were: BP 122/72 (BP Location: Left Arm)    Pulse 73    Temp 98.3 F (36.8 C) (Oral)    Resp 18    Ht 5\' 7"  (1.702 m)    Wt 61.2 kg (135 lb)    SpO2 100%    BMI 21.14 kg/m    If your blood pressure (BP) was elevated above 135/85 this visit, please have this repeated by your doctor within one month.

## 2018-03-02 NOTE — ED Notes (Signed)
ED Provider at bedside. 

## 2018-08-14 ENCOUNTER — Encounter: Payer: Self-pay | Admitting: Nurse Practitioner

## 2018-08-14 ENCOUNTER — Ambulatory Visit: Payer: Self-pay | Admitting: Nurse Practitioner

## 2018-08-14 VITALS — BP 120/82 | HR 79 | Temp 98.5°F | Ht 67.0 in | Wt 140.0 lb

## 2018-08-14 DIAGNOSIS — J00 Acute nasopharyngitis [common cold]: Secondary | ICD-10-CM

## 2018-08-14 DIAGNOSIS — R110 Nausea: Secondary | ICD-10-CM

## 2018-08-14 LAB — POC INFLUENZA A&B (BINAX/QUICKVUE)
INFLUENZA A, POC: NEGATIVE
Influenza B, POC: NEGATIVE

## 2018-08-14 LAB — POCT RAPID STREP A (OFFICE): Rapid Strep A Screen: NEGATIVE

## 2018-08-14 MED ORDER — CHLORPHEN-PE-ACETAMINOPHEN 4-10-325 MG PO TABS: 1.0000 | ORAL_TABLET | Freq: Two times a day (BID) | ORAL | 0 refills | Status: AC

## 2018-08-14 MED ORDER — PROMETHAZINE HCL 12.5 MG PO TABS: 12.5000 mg | ORAL_TABLET | Freq: Three times a day (TID) | ORAL | 0 refills | Status: DC | PRN

## 2018-08-14 NOTE — Progress Notes (Signed)
Subjective:  Patient ID: ORBY TANGEN, male    DOB: [birth year]  Age: 27 y.o. MRN: [id number]  CC: Emesis (pt is complaining of vomitting at times, bodyche,feel cold/ going on 2 days/family is sick as well. )  URI   This is a new problem. The current episode started yesterday. The problem has been unchanged. There has been no fever. Associated symptoms include congestion, coughing, ear pain, headaches, joint pain, nausea, a plugged ear sensation, rhinorrhea, sinus pain, sneezing, a sore throat, swollen glands and vomiting. Pertinent negatives include no chest pain, rash or wheezing. He has tried nothing for the symptoms.  everyday smoker.  Reviewed past Medical, Social and Family history today.  Outpatient Medications Prior to Visit  Medication Sig Dispense Refill  . methocarbamol (ROBAXIN) 500 MG tablet Take 2 tablets (1,000 mg total) by mouth every 6 (six) hours as needed for muscle spasms. (Patient not taking: Reported on 08/14/2018) 30 tablet 0  . omeprazole (PRILOSEC) 20 MG capsule Take 1 capsule (20 mg total) by mouth daily. (Patient not taking: Reported on 08/14/2018) 20 capsule 0  . ondansetron (ZOFRAN ODT) 4 MG disintegrating tablet 4mg  ODT q4 hours prn nausea/vomit (Patient not taking: Reported on 08/14/2018) 4 tablet 0   No facility-administered medications prior to visit.     ROS See HPI  Objective:  BP 120/82   Pulse 79   Temp 98.5 F (36.9 C) (Oral)   Ht 5\' 7"  (1.702 m)   Wt 140 lb (63.5 kg)   SpO2 96%   BMI 21.93 kg/m   BP Readings from Last 3 Encounters:  08/14/18 120/82  03/02/18 126/81  09/29/15 121/61    Wt Readings from Last 3 Encounters:  08/14/18 140 lb (63.5 kg)  03/02/18 135 lb (61.2 kg)  09/29/15 135 lb (61.2 kg)    Physical Exam Vitals signs reviewed.  Constitutional:      General: He is not in acute distress. HENT:     Right Ear: Tympanic membrane, ear canal and external ear normal.     Left Ear: Tympanic membrane and ear canal normal.      Nose: Mucosal edema and rhinorrhea present.     Right Sinus: Maxillary sinus tenderness and frontal sinus tenderness present.     Left Sinus: Maxillary sinus tenderness and frontal sinus tenderness present.     Mouth/Throat:     Pharynx: Uvula midline. Posterior oropharyngeal erythema present. No oropharyngeal exudate.     Tonsils: Swelling: 2+ on the right. 2+ on the left.  Eyes:     General: No scleral icterus. Neck:     Musculoskeletal: Normal range of motion and neck supple.  Cardiovascular:     Rate and Rhythm: Normal rate and regular rhythm.  Pulmonary:     Effort: Pulmonary effort is normal.     Breath sounds: Normal breath sounds.  Lymphadenopathy:     Cervical: Cervical adenopathy present.  Neurological:     Mental Status: He is alert and oriented to person, place, and time.     Lab Results  Component Value Date   WBC 13.3 (H) 01/09/2015   HGB 17.0 01/09/2015   HCT 48.0 01/09/2015   PLT 188 01/09/2015   GLUCOSE 112 (H) 01/09/2015   ALT 19 01/09/2015   AST 19 01/09/2015   NA 136 01/09/2015   K 3.7 01/09/2015   CL 106 01/09/2015   CREATININE 0.86 01/09/2015   BUN 28 (H) 01/09/2015   CO2 20 (L) 01/09/2015    Dg  Finger Middle Right  Result Date: 03/02/2018 CLINICAL DATA:  Laceration EXAM: RIGHT MIDDLE FINGER 2+V COMPARISON:  07/08/2009 FINDINGS: There is no evidence of fracture or dislocation. There is no evidence of arthropathy or other focal bone abnormality. Soft tissues are unremarkable. IMPRESSION: Negative. Electronically Signed   By: Jasmine PangKim  Fujinaga M.D.   On: 03/02/2018 20:31    Assessment & Plan:   Gerilyn PilgrimJacob was seen today for emesis.  Diagnoses and all orders for this visit:  Acute nasopharyngitis -     POC Influenza A&B(BINAX/QUICKVUE) -     POCT rapid strep A -     promethazine (PHENERGAN) 12.5 MG tablet; Take 1 tablet (12.5 mg total) by mouth every 8 (eight) hours as needed for nausea or vomiting. -     Chlorphen-PE-Acetaminophen 4-10-325 MG TABS;  Take 1 tablet by mouth every 12 (twelve) hours for 3 days.  Nausea -     promethazine (PHENERGAN) 12.5 MG tablet; Take 1 tablet (12.5 mg total) by mouth every 8 (eight) hours as needed for nausea or vomiting.   I am having Dyke MaesJacob D. Hum start on promethazine and Chlorphen-PE-Acetaminophen. I am also having him maintain his omeprazole, ondansetron, and methocarbamol.  Meds ordered this encounter  Medications  . promethazine (PHENERGAN) 12.5 MG tablet    Sig: Take 1 tablet (12.5 mg total) by mouth every 8 (eight) hours as needed for nausea or vomiting.    Dispense:  20 tablet    Refill:  0    Order Specific Question:   Supervising Provider    Answer:   Dianne DunARON, TALIA M [[id number]]  . Chlorphen-PE-Acetaminophen 4-10-325 MG TABS    Sig: Take 1 tablet by mouth every 12 (twelve) hours for 3 days.    Dispense:  6 tablet    Refill:  0    Order Specific Question:   Supervising Provider    Answer:   Dianne DunARON, TALIA M [[id number]]    Problem List Items Addressed This Visit    None    Visit Diagnoses    Acute nasopharyngitis    -  Primary   Relevant Medications   promethazine (PHENERGAN) 12.5 MG tablet   Chlorphen-PE-Acetaminophen 4-10-325 MG TABS   Other Relevant Orders   POC Influenza A&B(BINAX/QUICKVUE) (Completed)   POCT rapid strep A (Completed)   Nausea       Relevant Medications   promethazine (PHENERGAN) 12.5 MG tablet       Follow-up: Return if symptoms worsen or fail to improve.  Alysia Pennaharlotte Xian Apostol, NP

## 2018-08-14 NOTE — Patient Instructions (Addendum)
Symptoms are due to viral infection. Maintain adequate oral hydration and clear liquid diet x 2days, then advance as tolerated.  Upper Respiratory Infection, Adult An upper respiratory infection (URI) affects the nose, throat, and upper air passages. URIs are caused by germs (viruses). The most common type of URI is often called "the common cold." Medicines cannot cure URIs, but you can do things at home to relieve your symptoms. URIs usually get better within 7-10 days. Follow these instructions at home: Activity  Rest as needed.  If you have a fever, stay home from work or school until your fever is gone, or until your doctor says you may return to work or school. ? You should stay home until you cannot spread the infection anymore (you are not contagious). ? Your doctor may have you wear a face mask so you have less risk of spreading the infection. Relieving symptoms  Gargle with a salt-water mixture 3-4 times a day or as needed. To make a salt-water mixture, completely dissolve -1 tsp of salt in 1 cup of warm water.  Use a cool-mist humidifier to add moisture to the air. This can help you breathe more easily. Eating and drinking   Drink enough fluid to keep your pee (urine) pale yellow.  Eat soups and other clear broths. General instructions   Take over-the-counter and prescription medicines only as told by your doctor. These include cold medicines, fever reducers, and cough suppressants.  Do not use any products that contain nicotine or tobacco. These include cigarettes and e-cigarettes. If you need help quitting, ask your doctor.  Avoid being where people are smoking (avoid secondhand smoke).  Make sure you get regular shots and get the flu shot every year.  Keep all follow-up visits as told by your doctor. This is important. How to avoid spreading infection to others   Wash your hands often with soap and water. If you do not have soap and water, use hand  sanitizer.  Avoid touching your mouth, face, eyes, or nose.  Cough or sneeze into a tissue or your sleeve or elbow. Do not cough or sneeze into your hand or into the air. Contact a doctor if:  You are getting worse, not better.  You have any of these: ? A fever. ? Chills. ? Brown or red mucus in your nose. ? Yellow or brown fluid (discharge)coming from your nose. ? Pain in your face, especially when you bend forward. ? Swollen neck glands. ? Pain with swallowing. ? White areas in the back of your throat. Get help right away if:  You have shortness of breath that gets worse.  You have very bad or constant: ? Headache. ? Ear pain. ? Pain in your forehead, behind your eyes, and over your cheekbones (sinus pain). ? Chest pain.  You have long-lasting (chronic) lung disease along with any of these: ? Wheezing. ? Long-lasting cough. ? Coughing up blood. ? A change in your usual mucus.  You have a stiff neck.  You have changes in your: ? Vision. ? Hearing. ? Thinking. ? Mood. Summary  An upper respiratory infection (URI) is caused by a germ called a virus. The most common type of URI is often called "the common cold."  URIs usually get better within 7-10 days.  Take over-the-counter and prescription medicines only as told by your doctor. This information is not intended to replace advice given to you by your health care provider. Make sure you discuss any questions you have with  your health care provider. Document Released: 01/31/2008 Document Revised: 04/06/2017 Document Reviewed: 04/06/2017 Elsevier Interactive Patient Education  2019 [employer].

## 2018-08-15 ENCOUNTER — Ambulatory Visit: Payer: Self-pay | Admitting: Family Medicine

## 2018-08-30 ENCOUNTER — Ambulatory Visit: Payer: Self-pay | Admitting: Family Medicine

## 2018-08-30 DIAGNOSIS — Z0289 Encounter for other administrative examinations: Secondary | ICD-10-CM

## 2019-03-15 ENCOUNTER — Encounter (HOSPITAL_BASED_OUTPATIENT_CLINIC_OR_DEPARTMENT_OTHER): Payer: Self-pay | Admitting: Adult Health

## 2019-03-15 ENCOUNTER — Emergency Department (HOSPITAL_BASED_OUTPATIENT_CLINIC_OR_DEPARTMENT_OTHER)
Admission: EM | Admit: 2019-03-15 | Discharge: 2019-03-15 | Disposition: A | Discharge status: Home / Self Care | Payer: Self-pay | Attending: Emergency Medicine | Admitting: Emergency Medicine

## 2019-03-15 ENCOUNTER — Other Ambulatory Visit: Payer: Self-pay

## 2019-03-15 DIAGNOSIS — W57XXXA Bitten or stung by nonvenomous insect and other nonvenomous arthropods, initial encounter: Secondary | ICD-10-CM | POA: Insufficient documentation

## 2019-03-15 DIAGNOSIS — Y999 Unspecified external cause status: Secondary | ICD-10-CM | POA: Insufficient documentation

## 2019-03-15 DIAGNOSIS — F17200 Nicotine dependence, unspecified, uncomplicated: Secondary | ICD-10-CM | POA: Insufficient documentation

## 2019-03-15 DIAGNOSIS — Y9289 Other specified places as the place of occurrence of the external cause: Secondary | ICD-10-CM | POA: Insufficient documentation

## 2019-03-15 DIAGNOSIS — T7840XA Allergy, unspecified, initial encounter: Secondary | ICD-10-CM | POA: Insufficient documentation

## 2019-03-15 DIAGNOSIS — S40261A Insect bite (nonvenomous) of right shoulder, initial encounter: Secondary | ICD-10-CM | POA: Insufficient documentation

## 2019-03-15 DIAGNOSIS — Y939 Activity, unspecified: Secondary | ICD-10-CM | POA: Insufficient documentation

## 2019-03-15 MED ORDER — FAMOTIDINE IN NACL 20-0.9 MG/50ML-% IV SOLN
20.0000 mg | Freq: Once | INTRAVENOUS | Status: AC
  Administered 2019-03-15: 18:00:00 20 mg via INTRAVENOUS
  Filled 2019-03-15: qty 50

## 2019-03-15 MED ORDER — PREDNISONE 10 MG PO TABS: 50.0000 mg | ORAL_TABLET | Freq: Every day | ORAL | 0 refills | Status: AC

## 2019-03-15 MED ORDER — EPINEPHRINE 0.3 MG/0.3ML IJ SOAJ
0.3000 mg | Freq: Once | INTRAMUSCULAR | Status: AC
  Administered 2019-03-15: 18:00:00 0.3 mg via INTRAMUSCULAR
  Filled 2019-03-15: qty 0.3

## 2019-03-15 MED ORDER — EPINEPHRINE 0.3 MG/0.3ML IJ SOAJ
0.3000 mg | INTRAMUSCULAR | 0 refills | Status: DC | PRN
Start: 2019-03-15 — End: 2022-04-14

## 2019-03-15 MED ORDER — METHYLPREDNISOLONE SODIUM SUCC 125 MG IJ SOLR
125.0000 mg | Freq: Once | INTRAMUSCULAR | Status: AC
  Administered 2019-03-15: 125 mg via INTRAVENOUS
  Filled 2019-03-15: qty 2

## 2019-03-15 NOTE — ED Notes (Signed)
Patients eyes are less swollen and he is able to open them easier. No noted throat pain or swelling

## 2019-03-15 NOTE — ED Provider Notes (Signed)
[facility] EMERGENCY DEPARTMENT Provider Note   CSN: [id number] Arrival date & time: 03/15/19  1801    History   Chief Complaint Chief Complaint  Patient presents with  . Allergic Reaction    HPI Wesley Hartman is a 28 y.o. male.     28 year old male presents with possible allergic reaction.  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  Patient began to itch and break out in a rash all over his body, also reports feel like he is having hard time swallowing.  Patient took 2 Benadryl on his way to the emergency room.  Patient states that the difficulty swallowing has resolved however now his eyes are swollen shut.  No known allergies to insects.  Denies wheezing, difficulty breathing and abdominal pain, nausea, vomiting.  No other complaints or concerns.     Past Medical History:  Diagnosis Date  . GERD (gastroesophageal reflux disease)   . Irregular heartbeat    benign  . Nausea & vomiting     Patient Active Problem List   Diagnosis Date Noted  . Gastritis 09/07/2014  . RUQ pain 09/07/2014    Past Surgical History:  Procedure Laterality Date  . WISDOM TOOTH EXTRACTION          Home Medications    Prior to Admission medications   Medication Sig Start Date End Date Taking? Authorizing Provider  EPINEPHrine 0.3 mg/0.3 mL IJ SOAJ injection Inject 0.3 mLs (0.3 mg total) into the muscle as needed for anaphylaxis. 03/15/19   Jeannie FendMurphy, Meggin Ola A, PA-C  predniSONE (DELTASONE) 10 MG tablet Take 5 tablets (50 mg total) by mouth daily for 5 days. 03/15/19 03/20/19  Jeannie FendMurphy, Levina Boyack A, PA-C  promethazine (PHENERGAN) 12.5 MG tablet Take 1 tablet (12.5 mg total) by mouth every 8 (eight) hours as needed for nausea or vomiting. 08/14/18   Nche, Bonna Gainsharlotte Lum, NP    Family History Family History  Problem Relation Age of Onset  . Healthy Mother        Living/Living  . Cancer Maternal Grandfather   . COPD Maternal Grandfather    . Heart disease Maternal Grandfather   . Parkinson's disease Maternal Grandmother   . Gallbladder disease Other        Maternal Great Uncle-Cancer  . Arthritis Maternal Uncle        Hip Replacement  . Diabetes Sister     Social History Social History   Tobacco Use  . Smoking status: Current Every Day Smoker    Packs/day: 1.00  . Smokeless tobacco: Never Used  Substance Use Topics  . Alcohol use: No  . Drug use: No     Allergies   Doxycycline   Review of Systems Review of Systems  Constitutional: Positive for chills. Negative for diaphoresis and fever.  HENT: Negative for congestion, trouble swallowing and voice change.   Respiratory: Negative for cough, choking and shortness of breath.   Gastrointestinal: Negative for nausea and vomiting.  Musculoskeletal: Negative for arthralgias, joint swelling and myalgias.  Skin: Positive for rash.  Allergic/Immunologic: Negative for immunocompromised state.  Psychiatric/Behavioral: Negative for confusion.  All other systems reviewed and are negative.    Physical Exam Updated Vital Signs BP 127/78   Pulse (!) 56   Temp 98.5 F (36.9 C) (Oral)   Resp 15   Ht 5\' 6"  (1.676 m)   Wt 61.2 kg   SpO2 96%   BMI 21.79 kg/m  Physical Exam Vitals signs and nursing note reviewed.  Constitutional:      Appearance: He is normal weight.  HENT:     Nose: Nose normal.     Mouth/Throat:     Mouth: Mucous membranes are moist.     Pharynx: No oropharyngeal exudate or posterior oropharyngeal erythema.  Cardiovascular:     Rate and Rhythm: Normal rate and regular rhythm.     Pulses: Normal pulses.     Heart sounds: Normal heart sounds.  Pulmonary:     Effort: Pulmonary effort is normal.     Breath sounds: Normal breath sounds. No wheezing.  Skin:    General: Skin is warm and dry.     Findings: Rash present.     Comments: Urticarial rash to trunk.  Upper and lower eyelids swollen bilaterally.  Neurological:     Mental Status:  He is alert and oriented to person, place, and time.  Psychiatric:        Mood and Affect: Mood normal.        Behavior: Behavior normal.      ED Treatments / Results  Labs (all labs ordered are listed, but only abnormal results are displayed) Labs Reviewed - No data to display  EKG None  Radiology No results found.  Procedures .Critical Care Performed by: Tacy Learn, PA-C Authorized by: Tacy Learn, PA-C   Critical care provider statement:    Critical care time (minutes):  45   Critical care was necessary to treat or prevent imminent or life-threatening deterioration of the following conditions:  Shock   Critical care was time spent personally by me on the following activities:  Discussions with consultants, evaluation of patient's response to treatment, examination of patient, ordering and performing treatments and interventions, ordering and review of laboratory studies, ordering and review of radiographic studies, pulse oximetry, re-evaluation of patient's condition, obtaining history from patient or surrogate and review of old charts   (including critical care time)  Medications Ordered in ED Medications  methylPREDNISolone sodium succinate (SOLU-MEDROL) 125 mg/2 mL injection 125 mg (125 mg Intravenous Given 03/15/19 1819)  famotidine (PEPCID) IVPB 20 mg premix (0 mg Intravenous Stopped 03/15/19 1906)  EPINEPHrine (EPI-PEN) injection 0.3 mg (0.3 mg Intramuscular Given 03/15/19 1819)     Initial Impression / Assessment and Plan / ED Course  I have reviewed the triage vital signs and the nursing notes.  Pertinent labs & imaging results that were available during my care of the patient were reviewed by me and considered in my medical decision making (see chart for details).  Clinical Course as of Mar 14 2124  Sat Mar 15, 6339  950 28 year old male with allergic reaction after insect sting to the right shoulder earlier today.  Patient took Benadryl on his way to  the hospital which improved the scratchiness in his throat.  On arrival patient was found to have diffuse urticarial rash to his trunk, upper and lower eyelids with significant swelling.  Patient was given epi, Solu-Medrol, Pepcid.  On recheck patient is improving, rash and itching have totally resolved, swelling of the eyelids is improving.  No needs at this time.  Plan is to continue to monitor patient and discharge after observation period if continuing to do well.   [LM]  2125 Patient was observed for 3 hours after IV dose, continues to do well and is ready for discharge.  Patient was prescribed prednisone and an EpiPen, advised to continue with Benadryl, Pepcid and Zyrtec.  Patient return to ER for any worsening or concerning symptoms.   [LM]    Clinical Course User Index [LM] Jeannie FendMurphy, Jilliam Bellmore A, PA-C      Final Clinical Impressions(s) / ED Diagnoses   Final diagnoses:  Allergic reaction, initial encounter    ED Discharge Orders         Ordered    EPINEPHrine 0.3 mg/0.3 mL IJ SOAJ injection  As needed     03/15/19 1830    predniSONE (DELTASONE) 10 MG tablet  Daily     03/15/19 2122           Jeannie FendMurphy, Achaia Garlock A, PA-C 03/15/19 2126    Alvira MondaySchlossman, Erin, MD 03/16/19 1527

## 2019-03-15 NOTE — Discharge Instructions (Signed)
Take prednisone as prescribed and complete the full course.  You can start this medication tomorrow.  Take Benadryl and Pepcid as well as Zyrtec for the next 5 days to help with allergy symptoms. If you have rebound swelling or worsening symptoms, you should use your EpiPen and return to the emergency room. We recommend he carry an EpiPen with you in the event you are stung by another insect and have a reaction similar to today.  If you use your EpiPen you need to return to the emergency room.  Talk to your pharmacist about how to obtain your EpiPen at the best price, you may need to go to an EpiPen website to apply for a coupon code.

## 2019-03-15 NOTE — ED Triage Notes (Signed)
Presents one hour post Insect bite, pt was out in a horse [place] and felt something sting him on his right shoulder. He took 2 benadryl and then he began to swell and developed urticaria and pruritis. He has bno stridor or wheezing or SOB.

## 2019-05-18 ENCOUNTER — Encounter (HOSPITAL_BASED_OUTPATIENT_CLINIC_OR_DEPARTMENT_OTHER): Payer: Self-pay | Admitting: *Deleted

## 2019-05-18 ENCOUNTER — Other Ambulatory Visit: Payer: Self-pay

## 2019-05-18 ENCOUNTER — Emergency Department (HOSPITAL_BASED_OUTPATIENT_CLINIC_OR_DEPARTMENT_OTHER)
Admission: EM | Admit: 2019-05-18 | Discharge: 2019-05-18 | Disposition: A | Discharge status: Home / Self Care | Payer: Self-pay | Attending: Emergency Medicine | Admitting: Emergency Medicine

## 2019-05-18 DIAGNOSIS — Y9389 Activity, other specified: Secondary | ICD-10-CM | POA: Insufficient documentation

## 2019-05-18 DIAGNOSIS — Y998 Other external cause status: Secondary | ICD-10-CM | POA: Insufficient documentation

## 2019-05-18 DIAGNOSIS — W293XXA Contact with powered garden and outdoor hand tools and machinery, initial encounter: Secondary | ICD-10-CM | POA: Insufficient documentation

## 2019-05-18 DIAGNOSIS — S81812A Laceration without foreign body, left lower leg, initial encounter: Secondary | ICD-10-CM | POA: Insufficient documentation

## 2019-05-18 DIAGNOSIS — F17210 Nicotine dependence, cigarettes, uncomplicated: Secondary | ICD-10-CM | POA: Insufficient documentation

## 2019-05-18 DIAGNOSIS — Y9289 Other specified places as the place of occurrence of the external cause: Secondary | ICD-10-CM | POA: Insufficient documentation

## 2019-05-18 MED ORDER — CEPHALEXIN 250 MG PO CAPS
ORAL_CAPSULE | ORAL | Status: AC
  Filled 2019-05-18: qty 4

## 2019-05-18 MED ORDER — CEPHALEXIN 500 MG PO CAPS: 500.0000 mg | ORAL_CAPSULE | Freq: Four times a day (QID) | ORAL | 0 refills | Status: DC

## 2019-05-18 MED ORDER — LIDOCAINE-EPINEPHRINE (PF) 2 %-1:200000 IJ SOLN
INTRAMUSCULAR | Status: AC
  Filled 2019-05-18: qty 10

## 2019-05-18 MED ORDER — CEPHALEXIN 250 MG PO CAPS
1000.0000 mg | ORAL_CAPSULE | Freq: Once | ORAL | Status: AC
  Administered 2019-05-18: 1000 mg via ORAL

## 2019-05-18 NOTE — ED Notes (Signed)
Pt had lac to left shin- bleeding controlled. EDP applied stitches. Per pt. tdap within last 2 years.

## 2019-05-18 NOTE — ED Triage Notes (Signed)
2 laceration below pts knee approx 6-7 hours PTA from a chainsaw.  Bleeding controlled.

## 2019-05-18 NOTE — ED Notes (Signed)
Bacitracin and nonstick dressing applied to shin. Wound care instructions provided

## 2019-05-18 NOTE — ED Provider Notes (Addendum)
[facility] DEPT [facility] Provider Note: Georgena Spurling, MD, FACEP  CSN: [id number] MRN: [id number] ARRIVAL: 05/18/19 at [place]: [place]  Laceration   HISTORY OF PRESENT ILLNESS  05/18/19 12:17 AM Wesley Hartman is a 28 y.o. male who cut his left lower leg on a chainsaw about 6 PM yesterday evening.  The chainsaw was not running at full speed and did not cut through the full thickness of the skin.  He has 2 lacerations inferior to the left patella.  There is no functional deficit.  Bleeding is controlled.  Pain is moderate, worse with palpation or movement.  Tetanus is up-to-date.   Past Medical History:  Diagnosis Date   GERD (gastroesophageal reflux disease)    Irregular heartbeat    benign   Nausea & vomiting     Past Surgical History:  Procedure Laterality Date   WISDOM TOOTH EXTRACTION      Family History  Problem Relation Age of Onset   Healthy Mother        Living/Living   Cancer Maternal Grandfather    COPD Maternal Grandfather    Heart disease Maternal Grandfather    Parkinson's disease Maternal Grandmother    Gallbladder disease Other        Maternal Great Uncle-Cancer   Arthritis Maternal Uncle        Hip Replacement   Diabetes Sister     Social History   Tobacco Use   Smoking status: Current Every Day Smoker    Packs/day: 1.00   Smokeless tobacco: Never Used  Substance Use Topics   Alcohol use: No   Drug use: No    Prior to Admission medications   Medication Sig Start Date End Date Taking? Authorizing Provider  cephALEXin (KEFLEX) 500 MG capsule Take 1 capsule (500 mg total) by mouth 4 (four) times daily. 05/18/19   Delayna Sparlin, MD  EPINEPHrine 0.3 mg/0.3 mL IJ SOAJ injection Inject 0.3 mLs (0.3 mg total) into the muscle as needed for anaphylaxis. 03/15/19   Tacy Learn, PA-C  promethazine (PHENERGAN) 12.5 MG tablet Take 1 tablet (12.5 mg total) by mouth every 8 (eight) hours as needed for nausea or  vomiting. 08/14/18   Nche, Charlene Brooke, NP    Allergies Doxycycline   REVIEW OF SYSTEMS  Negative except as noted here or in the History of Present Illness.   PHYSICAL EXAMINATION  Initial Vital Signs Blood pressure 133/82, pulse 66, temperature 98 F (36.7 C), temperature source Oral, resp. rate 19, height 5\' 7"  (1.702 m), weight 65.3 kg, SpO2 100 %.  Examination General: Well-developed, well-nourished male in no acute distress; appearance consistent with age of record HENT: normocephalic; atraumatic Eyes: Normal appearance Neck: supple Heart: regular rate and rhythm Lungs: clear to auscultation bilaterally Abdomen: soft; nondistended; nontender; bowel sounds present Extremities: No deformity; full range of motion; pulses normal Neurologic: Awake, alert and oriented; motor function intact in all extremities and symmetric; no facial droop Skin: Warm and dry; parallel lacerations inferior to left patella:    Psychiatric: Normal mood and affect   RESULTS  Summary of this visit's results, reviewed by myself:   EKG Interpretation  Date/Time:    Ventricular Rate:    PR Interval:    QRS Duration:   QT Interval:    QTC Calculation:   R Axis:     Text Interpretation:        Laboratory Studies: No results found for this or any previous visit (from the  past 24 hour(s)). Imaging Studies: No results found.  ED COURSE and MDM  Nursing notes and initial vitals signs, including pulse oximetry, reviewed.  Vitals:   05/18/19 0011  BP: 133/82  Pulse: 66  Resp: 19  Temp: 98 F (36.7 C)  TempSrc: Oral  SpO2: 100%  Weight: 65.3 kg  Height: 5\' 7"  (1.702 m)   The lacerations are not through the full-thickness of the skin and I believe the benefits of closure outweigh the risk.  We will start him on Keflex and have him return if signs or symptoms of infection show up.  Otherwise he was advised to have his sutures removed in 10 days.  PROCEDURES   LACERATION  REPAIR Performed by: Carlisle BeersJohn L Naomy Esham Authorized by: Carlisle BeersJohn L Mithcell Schumpert Consent: Verbal consent obtained. Risks and benefits: risks, benefits and alternatives were discussed Consent given by: patient Patient identity confirmed: provided demographic data Prepped and Draped in normal sterile fashion Wound explored  Laceration Location: Inferior to left knee  Laceration Length: 4.5 cm  No Foreign Bodies seen or palpated  Anesthesia: local infiltration  Local anesthetic: lidocaine 2% with epinephrine  Anesthetic total: 2.5 ml  Irrigation method: syringe Amount of cleaning: Copious  Skin closure: 4-0 Prolene  Number of sutures: 6  Technique: Simple interrupted  Patient tolerance: Patient tolerated the procedure well with no immediate complications.   LACERATION REPAIR Performed by: Carlisle BeersJohn L Aarsh Fristoe Authorized by: Carlisle BeersJohn L Brittain Smithey Consent: Verbal consent obtained. Risks and benefits: risks, benefits and alternatives were discussed Consent given by: patient Patient identity confirmed: provided demographic data Prepped and Draped in normal sterile fashion Wound explored  Laceration Location: Inferior to left knee  Laceration Length: 3.5 cm  No Foreign Bodies seen or palpated  Anesthesia: local infiltration  Local anesthetic: lidocaine 2% with epinephrine  Anesthetic total: 2.5 ml  Irrigation method: syringe Amount of cleaning: Copious  Skin closure: 4-0 Prolene  Number of sutures: 4  Technique: Simple interrupted  Patient tolerance: Patient tolerated the procedure well with no immediate complications.    ED DIAGNOSES     ICD-10-CM   1. Laceration of left lower leg with complication, initial encounter  S81.812A   2. Contact with chainsaw as cause of accidental injury  [id number]        Olly Shiner, MD 05/18/19 [id number]    Paula LibraMolpus, [place], MD 05/18/19 [phone]

## 2019-06-12 ENCOUNTER — Encounter (HOSPITAL_BASED_OUTPATIENT_CLINIC_OR_DEPARTMENT_OTHER): Payer: Self-pay | Admitting: Emergency Medicine

## 2019-06-12 ENCOUNTER — Emergency Department (HOSPITAL_BASED_OUTPATIENT_CLINIC_OR_DEPARTMENT_OTHER): Payer: Self-pay

## 2019-06-12 ENCOUNTER — Other Ambulatory Visit: Payer: Self-pay

## 2019-06-12 ENCOUNTER — Emergency Department (HOSPITAL_BASED_OUTPATIENT_CLINIC_OR_DEPARTMENT_OTHER)
Admission: EM | Admit: 2019-06-12 | Discharge: 2019-06-12 | Disposition: A | Discharge status: Home / Self Care | Payer: Self-pay | Attending: Emergency Medicine | Admitting: Emergency Medicine

## 2019-06-12 DIAGNOSIS — Y999 Unspecified external cause status: Secondary | ICD-10-CM | POA: Diagnosis not present

## 2019-06-12 DIAGNOSIS — S0502XA Injury of conjunctiva and corneal abrasion without foreign body, left eye, initial encounter: Secondary | ICD-10-CM | POA: Insufficient documentation

## 2019-06-12 DIAGNOSIS — M545 Low back pain: Secondary | ICD-10-CM | POA: Insufficient documentation

## 2019-06-12 DIAGNOSIS — F17200 Nicotine dependence, unspecified, uncomplicated: Secondary | ICD-10-CM | POA: Insufficient documentation

## 2019-06-12 DIAGNOSIS — S0181XA Laceration without foreign body of other part of head, initial encounter: Secondary | ICD-10-CM | POA: Insufficient documentation

## 2019-06-12 DIAGNOSIS — S0012XA Contusion of left eyelid and periocular area, initial encounter: Secondary | ICD-10-CM

## 2019-06-12 DIAGNOSIS — S01112A Laceration without foreign body of left eyelid and periocular area, initial encounter: Secondary | ICD-10-CM

## 2019-06-12 DIAGNOSIS — S0993XA Unspecified injury of face, initial encounter: Secondary | ICD-10-CM

## 2019-06-12 DIAGNOSIS — S0121XA Laceration without foreign body of nose, initial encounter: Secondary | ICD-10-CM | POA: Insufficient documentation

## 2019-06-12 DIAGNOSIS — R079 Chest pain, unspecified: Secondary | ICD-10-CM | POA: Insufficient documentation

## 2019-06-12 DIAGNOSIS — Y929 Unspecified place or not applicable: Secondary | ICD-10-CM | POA: Insufficient documentation

## 2019-06-12 DIAGNOSIS — S01119A Laceration without foreign body of unspecified eyelid and periocular area, initial encounter: Secondary | ICD-10-CM | POA: Insufficient documentation

## 2019-06-12 DIAGNOSIS — R102 Pelvic and perineal pain: Secondary | ICD-10-CM | POA: Insufficient documentation

## 2019-06-12 DIAGNOSIS — Y939 Activity, unspecified: Secondary | ICD-10-CM | POA: Insufficient documentation

## 2019-06-12 LAB — CBC
HCT: 42.7 % (ref 39.0–52.0)
Hemoglobin: 14 g/dL (ref 13.0–17.0)
MCH: 29.4 pg (ref 26.0–34.0)
MCHC: 32.8 g/dL (ref 30.0–36.0)
MCV: 89.5 fL (ref 80.0–100.0)
Platelets: 211 10*3/uL (ref 150–400)
RBC: 4.77 MIL/uL (ref 4.22–5.81)
RDW: 12.1 % (ref 11.5–15.5)
WBC: 19.4 10*3/uL — ABNORMAL HIGH (ref 4.0–10.5)
nRBC: 0 % (ref 0.0–0.2)

## 2019-06-12 LAB — URINALYSIS, ROUTINE W REFLEX MICROSCOPIC
Bilirubin Urine: NEGATIVE
Glucose, UA: NEGATIVE mg/dL
Hgb urine dipstick: NEGATIVE
Ketones, ur: NEGATIVE mg/dL
Leukocytes,Ua: NEGATIVE
Nitrite: NEGATIVE
Protein, ur: NEGATIVE mg/dL
Specific Gravity, Urine: 1.015 (ref 1.005–1.030)
pH: 7 (ref 5.0–8.0)

## 2019-06-12 LAB — COMPREHENSIVE METABOLIC PANEL
ALT: 26 U/L (ref 0–44)
AST: 23 U/L (ref 15–41)
Albumin: 4.4 g/dL (ref 3.5–5.0)
Alkaline Phosphatase: 70 U/L (ref 38–126)
Anion gap: 10 (ref 5–15)
BUN: 30 mg/dL — ABNORMAL HIGH (ref 6–20)
CO2: 25 mmol/L (ref 22–32)
Calcium: 9.1 mg/dL (ref 8.9–10.3)
Chloride: 103 mmol/L (ref 98–111)
Creatinine, Ser: 0.78 mg/dL (ref 0.61–1.24)
GFR calc Af Amer: >60 mL/min (ref >60)
GFR calc non Af Amer: >60 mL/min (ref >60)
Glucose, Bld: 113 mg/dL — ABNORMAL HIGH (ref 70–99)
Potassium: 3.9 mmol/L (ref 3.5–5.1)
Sodium: 138 mmol/L (ref 135–145)
Total Bilirubin: 0.5 mg/dL (ref 0.3–1.2)
Total Protein: 6.8 g/dL (ref 6.5–8.1)

## 2019-06-12 MED ORDER — TETRACAINE HCL 0.5 % OP SOLN
2.0000 [drp] | Freq: Once | OPHTHALMIC | Status: AC
  Administered 2019-06-12: 2 [drp] via OPHTHALMIC
  Filled 2019-06-12: qty 4

## 2019-06-12 MED ORDER — ACETAMINOPHEN 500 MG PO TABS: 500.0000 mg | ORAL_TABLET | Freq: Four times a day (QID) | ORAL | 0 refills | Status: DC | PRN

## 2019-06-12 MED ORDER — IOHEXOL 300 MG/ML  SOLN
100.0000 mL | Freq: Once | INTRAMUSCULAR | Status: AC | PRN
  Administered 2019-06-12: 100 mL via INTRAVENOUS

## 2019-06-12 MED ORDER — HYDROMORPHONE HCL 1 MG/ML IJ SOLN
0.5000 mg | Freq: Once | INTRAMUSCULAR | Status: AC
  Administered 2019-06-12: 11:00:00 0.5 mg via INTRAVENOUS
  Filled 2019-06-12: qty 1

## 2019-06-12 MED ORDER — FLUORESCEIN SODIUM 1 MG OP STRP
1.0000 | ORAL_STRIP | Freq: Once | OPHTHALMIC | Status: AC
  Administered 2019-06-12: 11:00:00 1 via OPHTHALMIC
  Filled 2019-06-12: qty 1

## 2019-06-12 MED ORDER — CYCLOBENZAPRINE HCL 10 MG PO TABS: 10.0000 mg | ORAL_TABLET | Freq: Two times a day (BID) | ORAL | 0 refills | Status: DC | PRN

## 2019-06-12 MED ORDER — ERYTHROMYCIN 5 MG/GM OP OINT: TOPICAL_OINTMENT | OPHTHALMIC | 0 refills | Status: DC

## 2019-06-12 MED ORDER — LIDOCAINE-EPINEPHRINE 2 %-1:100000 IJ SOLN
10.0000 mL | Freq: Once | INTRAMUSCULAR | Status: DC
  Filled 2019-06-12: qty 10.2

## 2019-06-12 MED ORDER — LIDOCAINE-EPINEPHRINE (PF) 2 %-1:200000 IJ SOLN
INTRAMUSCULAR | Status: AC
  Administered 2019-06-12: 10 mL
  Filled 2019-06-12: qty 10

## 2019-06-12 MED ORDER — IBUPROFEN 600 MG PO TABS: 600.0000 mg | ORAL_TABLET | Freq: Four times a day (QID) | ORAL | 0 refills | Status: DC | PRN

## 2019-06-12 MED FILL — CYCLOBENZAPRINE HCL 10 MG T: 10 | 5 days supply | Qty: 10 | Fill #0

## 2019-06-12 MED FILL — IBUPROFEN 600 MG TABLET: 600 | 7 days supply | Qty: 30 | Fill #0

## 2019-06-12 MED FILL — ERYTHROMYCIN EYE OINTMENT: 5 | 14 days supply | Qty: 4 | Fill #0

## 2019-06-12 MED FILL — ACETAMINOPHEN EXTRA STRENGT: 500 | 25 days supply | Qty: 100 | Fill #0

## 2019-06-12 NOTE — Discharge Instructions (Signed)
Apply erythromycin ointment to the left lower eyelid 4 times daily for the next 7 days.  Apply ice to help with swelling.  Keep the area clean and dry.  Follow-up with ophthalmology within 48 hours for reevaluation of the abrasion to your eye.  Return to the emergency department immediately if any concerning signs or symptoms develop such as fevers, severe swelling, abnormal drainage, vision loss, or pain with eye movements.   Alternate 600 mg of ibuprofen and [phone] mg of Tylenol every 3 hours as needed for pain. Do not exceed 4000 mg of Tylenol daily. You may take Flexeril up to twice daily as needed for muscle spasms. This medication may make you drowsy, so I typically only recommended at night. If this medication makes you drowsy throughout the day, no driving, drinking alcohol, or operating heavy machinery. You may also cut these tablets in half.    Ice to areas of soreness for the next few days and then may move to heat. Do some gentle stretching throughout the day, especially during hot showers or baths. Take short frequent walks and avoid prolonged periods of sitting or laying.   Expect to be sore for the next few day and follow up with primary care physician for recheck of ongoing symptoms but return to ER for emergent changing or worsening of symptoms such as severe headache that gets worse, altered mental status/behaving unusually, persistent vomiting, excessive drowsiness, numbness to the arms or legs, unsteady gait, or slurred speech.

## 2019-06-12 NOTE — ED Notes (Signed)
Pt placed on 5-lead. 

## 2019-06-12 NOTE — ED Notes (Signed)
ED Provider at bedside. 
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
## 2019-06-27 ENCOUNTER — Other Ambulatory Visit: Payer: Self-pay

## 2019-06-27 DIAGNOSIS — Z20822 Contact with and (suspected) exposure to covid-19: Secondary | ICD-10-CM

## 2019-06-28 LAB — NOVEL CORONAVIRUS, NAA: SARS-CoV-2, NAA: NOT DETECTED
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
## 2021-12-21 ENCOUNTER — Encounter (HOSPITAL_COMMUNITY): Payer: Self-pay

## 2021-12-21 ENCOUNTER — Other Ambulatory Visit: Payer: Self-pay

## 2021-12-21 ENCOUNTER — Emergency Department (HOSPITAL_COMMUNITY)
Admission: EM | Admit: 2021-12-21 | Discharge: 2021-12-21 | Discharge status: Against Medical Advice | Payer: Self-pay | Attending: Emergency Medicine | Admitting: Emergency Medicine

## 2021-12-21 DIAGNOSIS — Z20822 Contact with and (suspected) exposure to covid-19: Secondary | ICD-10-CM | POA: Insufficient documentation

## 2021-12-21 DIAGNOSIS — F1910 Other psychoactive substance abuse, uncomplicated: Secondary | ICD-10-CM

## 2021-12-21 DIAGNOSIS — F1113 Opioid abuse with withdrawal: Secondary | ICD-10-CM | POA: Insufficient documentation

## 2021-12-21 DIAGNOSIS — R45851 Suicidal ideations: Secondary | ICD-10-CM | POA: Insufficient documentation

## 2021-12-21 LAB — URINALYSIS, ROUTINE W REFLEX MICROSCOPIC
Bacteria, UA: NONE SEEN
Bilirubin Urine: NEGATIVE
Glucose, UA: NEGATIVE mg/dL
Ketones, ur: NEGATIVE mg/dL
Leukocytes,Ua: NEGATIVE
Nitrite: NEGATIVE
Protein, ur: NEGATIVE mg/dL
Specific Gravity, Urine: 1.005 (ref 1.005–1.030)
pH: 9 — ABNORMAL HIGH (ref 5.0–8.0)

## 2021-12-21 LAB — RAPID URINE DRUG SCREEN, HOSP PERFORMED
Amphetamines: NOT DETECTED
Barbiturates: NOT DETECTED
Benzodiazepines: POSITIVE — AB
Cocaine: NOT DETECTED
Opiates: NOT DETECTED
Tetrahydrocannabinol: POSITIVE — AB

## 2021-12-21 LAB — COMPREHENSIVE METABOLIC PANEL
ALT: 21 U/L (ref 0–44)
AST: 21 U/L (ref 15–41)
Albumin: 3.1 g/dL — ABNORMAL LOW (ref 3.5–5.0)
Alkaline Phosphatase: 105 U/L (ref 38–126)
Anion gap: 5 (ref 5–15)
BUN: 8 mg/dL (ref 6–20)
CO2: 27 mmol/L (ref 22–32)
Calcium: 8.9 mg/dL (ref 8.9–10.3)
Chloride: 109 mmol/L (ref 98–111)
Creatinine, Ser: 0.82 mg/dL (ref 0.61–1.24)
GFR, Estimated: >60 mL/min (ref >60)
Glucose, Bld: 94 mg/dL (ref 70–99)
Potassium: 3.7 mmol/L (ref 3.5–5.1)
Sodium: 141 mmol/L (ref 135–145)
Total Bilirubin: 0.8 mg/dL (ref 0.3–1.2)
Total Protein: 6.5 g/dL (ref 6.5–8.1)

## 2021-12-21 LAB — CBC WITH DIFFERENTIAL/PLATELET
Abs Immature Granulocytes: 0.03 10*3/uL (ref 0.00–0.07)
Basophils Absolute: 0.1 10*3/uL (ref 0.0–0.1)
Basophils Relative: 1 %
Eosinophils Absolute: 0.2 10*3/uL (ref 0.0–0.5)
Eosinophils Relative: 2 %
HCT: 43.8 % (ref 39.0–52.0)
Hemoglobin: 14.4 g/dL (ref 13.0–17.0)
Immature Granulocytes: 0 %
Lymphocytes Relative: 23 %
Lymphs Abs: 2.2 10*3/uL (ref 0.7–4.0)
MCH: 28.5 pg (ref 26.0–34.0)
MCHC: 32.9 g/dL (ref 30.0–36.0)
MCV: 86.6 fL (ref 80.0–100.0)
Monocytes Absolute: 0.6 10*3/uL (ref 0.1–1.0)
Monocytes Relative: 6 %
Neutro Abs: 6.4 10*3/uL (ref 1.7–7.7)
Neutrophils Relative %: 68 %
Platelets: 235 10*3/uL (ref 150–400)
RBC: 5.06 MIL/uL (ref 4.22–5.81)
RDW: 12.5 % (ref 11.5–15.5)
WBC: 9.4 10*3/uL (ref 4.0–10.5)
nRBC: 0 % (ref 0.0–0.2)

## 2021-12-21 LAB — ETHANOL: Alcohol, Ethyl (B): <10 mg/dL (ref <10)

## 2021-12-21 LAB — RESP PANEL BY RT-PCR (FLU A&B, COVID) ARPGX2
Influenza A by PCR: NEGATIVE
Influenza B by PCR: NEGATIVE
SARS Coronavirus 2 by RT PCR: NEGATIVE

## 2021-12-21 LAB — LIPASE, BLOOD: Lipase: 18 U/L (ref 11–51)

## 2021-12-21 MED ORDER — HYDROXYZINE HCL 25 MG PO TABS
25.0000 mg | ORAL_TABLET | Freq: Four times a day (QID) | ORAL | Status: DC | PRN
  Administered 2021-12-21: 25 mg via ORAL
  Filled 2021-12-21: qty 1

## 2021-12-21 MED ORDER — ACETAMINOPHEN 500 MG PO TABS: 500.0000 mg | ORAL_TABLET | Freq: Four times a day (QID) | ORAL | Status: DC | PRN

## 2021-12-21 MED ORDER — DICYCLOMINE HCL 10 MG PO CAPS: 20.0000 mg | ORAL_CAPSULE | Freq: Four times a day (QID) | ORAL | Status: DC | PRN

## 2021-12-21 MED ORDER — METHOCARBAMOL 500 MG PO TABS: 500.0000 mg | ORAL_TABLET | Freq: Three times a day (TID) | ORAL | Status: DC | PRN

## 2021-12-21 MED ORDER — LOPERAMIDE HCL 2 MG PO CAPS: 2.0000 mg | ORAL_CAPSULE | ORAL | Status: DC | PRN

## 2021-12-21 MED ORDER — NAPROXEN 250 MG PO TABS: 500.0000 mg | ORAL_TABLET | Freq: Two times a day (BID) | ORAL | Status: DC | PRN

## 2021-12-21 MED ORDER — ONDANSETRON 4 MG PO TBDP: 4.0000 mg | ORAL_TABLET | Freq: Four times a day (QID) | ORAL | Status: DC | PRN

## 2021-12-21 NOTE — ED Provider Notes (Signed)
?[facility] ?Provider Note ? ? ?CSN: [id number] ?Arrival date & time: 12/21/21  1611 ? ?  ? ?History ? ?No chief complaint on file. ? ? ?Wesley Hartman is a 31 y.o. male. ? ?HPI ?Patient presents with similar concerns as those of his father who is also being evaluated currently.  He notes history of heroin and fentanyl use, oral, no IV administration.  Last use was 3 days ago.  Since that time he has had unsettled sensation, nausea, vomiting, and developed suicidal ideation without a discrete plan.  No focal pain, but there is generalized discomfort. ?  ? ?Home Medications ?Prior to Admission medications   ?Medication Sig Start Date End Date Taking? Authorizing Provider  ?acetaminophen (TYLENOL) 500 MG tablet Take 1 tablet (500 mg total) by mouth every 6 (six) hours as needed. ?Patient not taking: Reported on 12/21/2021 06/12/19   Michela Pitcher A, PA-C  ?cephALEXin (KEFLEX) 500 MG capsule Take 1 capsule (500 mg total) by mouth 4 (four) times daily. ?Patient not taking: Reported on 12/21/2021 05/18/19   Molpus, Jonny Ruiz, MD  ?cyclobenzaprine (FLEXERIL) 10 MG tablet Take 1 tablet (10 mg total) by mouth 2 (two) times daily as needed for muscle spasms. ?Patient not taking: Reported on 12/21/2021 06/12/19   Michela Pitcher A, PA-C  ?EPINEPHrine 0.3 mg/0.3 mL IJ SOAJ injection Inject 0.3 mLs (0.3 mg total) into the muscle as needed for anaphylaxis. ?Patient not taking: Reported on 12/21/2021 03/15/19   Army Melia A, PA-C  ?erythromycin ophthalmic ointment Place a 1/2 inch ribbon of ointment into the lower eyelid. ?Patient not taking: Reported on 12/21/2021 06/12/19   Michela Pitcher A, PA-C  ?ibuprofen (ADVIL) 600 MG tablet Take 1 tablet (600 mg total) by mouth every 6 (six) hours as needed. ?Patient not taking: Reported on 12/21/2021 06/12/19   Michela Pitcher A, PA-C  ?promethazine (PHENERGAN) 12.5 MG tablet Take 1 tablet (12.5 mg total) by mouth every 8 (eight) hours as needed for nausea or vomiting. ?Patient not taking:  Reported on 12/21/2021 08/14/18   Nche, Bonna Gains, NP  ?   ? ?Allergies    ?Doxycycline   ? ?Review of Systems   ?Review of Systems  ?All other systems reviewed and are negative. ? ?Physical Exam ?Updated Vital Signs ?BP (!) 136/99 (BP Location: Right Arm)   Pulse (!) 57   Temp 97.7 ?F (36.5 ?C) (Oral)   Resp 17   Ht 5\' 8"  (1.727 m)   Wt 59 kg   SpO2 100%   BMI 19.77 kg/m?  ?Physical Exam ?Vitals and nursing note reviewed.  ?Constitutional:   ?   General: He is not in acute distress. ?   Appearance: He is well-developed.  ?HENT:  ?   Head: Normocephalic and atraumatic.  ?Eyes:  ?   Conjunctiva/sclera: Conjunctivae normal.  ?Cardiovascular:  ?   Rate and Rhythm: Normal rate and regular rhythm.  ?Pulmonary:  ?   Effort: Pulmonary effort is normal. No respiratory distress.  ?   Breath sounds: No stridor.  ?Abdominal:  ?   General: There is no distension.  ?Skin: ?   General: Skin is warm and dry.  ?Neurological:  ?   Mental Status: He is alert and oriented to person, place, and time.  ?Psychiatric:     ?   Thought Content: Thought content includes suicidal ideation.  ? ? ?ED Results / Procedures / Treatments   ?Labs ?(all labs ordered are listed, but only abnormal results are displayed) ?Labs  Reviewed - No data to display ? ?EKG ?None ? ?Radiology ?No results found. ? ?Procedures ?Procedures  ? ? ?Medications Ordered in ED ?Medications - No data to display ? ?ED Course/ Medical Decision Making/ A&P ?This patient with a Hx of polysubstance abuse presents to the ED for concern of withdrawal, suicidal ideation, this involves an extensive number of treatment options, and is a complaint that carries with it a high risk of complications and morbidity.   ? ?The differential diagnosis includes complicated withdrawal, suicidal ideation, dehydration, electrolyte abnormalities ? ? ?Social Determinants of Health: ? ?Polysubstance abuse, psychiatric disease ? ?Additional history obtained: ? ?Additional history and/or  information obtained from family, notable for course of disease as above ? ? ?After the initial evaluation, orders, including: Labs, clinical opiate withdrawal score, with clonidine as needed were initiated. ? ? ?Patient placed on Cardiac and Pulse-Oximetry Monitors. ?The patient was maintained on a cardiac monitor.  The cardiac monitored showed an rhythm of 90 sinus normal ?The patient was also maintained on pulse oximetry. The readings were typically 100% room air normal ? ? ?On repeat evaluation of the patient stayed the same ? ?Lab Tests: ? ?I personally interpreted labs.  The pertinent results include: Unremarkable positive for multiple illicit substances. ? ? ?Consultations Obtained: ? ?I requested consultation with the psychiatry,  and discussed lab and imaging findings as well as pertinent plan - they recommend: Ongoing evaluation ? ?Dispostion / Final MDM: ? ?After consideration of the diagnostic results and the patient's response to treatment, adult male with history of polysubstance abuse now presenting 3 days after last use with concern for withdrawal complicate by suicidal ideation and reassuring labs, improved with clinical opiate withdrawal score protocol clonidine, is awaiting additional evaluation from behavioral health. ?Final Clinical Impression(s) / ED Diagnoses ?Final diagnoses:  ?Polysubstance abuse (HCC)  ?Suicidal ideation  ? ?  ?Gerhard Munch, MD ?12/21/21 1951 ? ?
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
## 2021-12-21 NOTE — ED Triage Notes (Signed)
Pt reports that he is going through opiate withdrawal.  States this is causing suicidal ideation.  Pt is alert.  Family at bedside.  Resp even and unlabored.  Skin warm and dry.  nad ?

## 2022-04-14 ENCOUNTER — Ambulatory Visit (INDEPENDENT_AMBULATORY_CARE_PROVIDER_SITE_OTHER): Payer: 59 | Admitting: Family Medicine

## 2022-04-14 ENCOUNTER — Encounter: Payer: Self-pay | Admitting: Family Medicine

## 2022-04-14 VITALS — BP 116/72 | HR 53 | Temp 97.5°F | Ht 67.0 in | Wt 124.0 lb

## 2022-04-14 DIAGNOSIS — G479 Sleep disorder, unspecified: Secondary | ICD-10-CM | POA: Insufficient documentation

## 2022-04-14 DIAGNOSIS — F1911 Other psychoactive substance abuse, in remission: Secondary | ICD-10-CM | POA: Diagnosis not present

## 2022-04-14 DIAGNOSIS — F630 Pathological gambling: Secondary | ICD-10-CM

## 2022-04-14 DIAGNOSIS — Z7689 Persons encountering health services in other specified circumstances: Secondary | ICD-10-CM

## 2022-04-14 DIAGNOSIS — F39 Unspecified mood [affective] disorder: Secondary | ICD-10-CM | POA: Insufficient documentation

## 2022-04-14 DIAGNOSIS — F330 Major depressive disorder, recurrent, mild: Secondary | ICD-10-CM

## 2022-04-14 MED ORDER — FLUOXETINE HCL 20 MG PO TABS: 20.0000 mg | ORAL_TABLET | Freq: Every day | ORAL | 0 refills | Status: DC

## 2022-04-14 MED ORDER — OLANZAPINE 2.5 MG PO TABS: 2.5000 mg | ORAL_TABLET | Freq: Every day | ORAL | 0 refills | Status: DC

## 2022-04-14 NOTE — Progress Notes (Signed)
Patient ID: Wesley Hartman, male  DOB: 12/15/1990, 31 y.o.   MRN: [id number] Patient Care Team    Relationship Specialty Notifications Start End  Natalia Leatherwood, DO PCP - General Family Medicine  04/14/22     Chief Complaint  Patient presents with   Establish Care   Depression    Pt c/o depression but never been tx'd in past;     Subjective:  Wesley Hartman is a 31 y.o.  male present for new patient establishment. All past medical history, surgical history, allergies, family history, immunizations, medications and social history were updated in the electronic medical record today. All recent labs, ED visits and hospitalizations within the last year were reviewed.  Patient presents with his significant other today to discuss his depression.  He reports he recently has struggled with opioid addiction.  He did not receive treatment, but was able to come off opioid on his own and has been clean from that standpoint.  He states he has noticed he has worsening depression.  He has feelings of guilt surrounding what he put himself and his family through.  He has a family history of manic depressive/bipolar in his father. [name] has never been on medications in the past for mental health.  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  His girlfriend states he can be very impulsive, spending entire checks gambling.    04/14/2022    3:13 PM  Depression screen PHQ 2/9  Decreased Interest 2  Down, Depressed, Hopeless 2  PHQ - 2 Score 4  Altered sleeping 3  Tired, decreased energy 3  Change in appetite 2  Feeling bad or failure about yourself  3  Trouble concentrating 0  Moving slowly or fidgety/restless 2  Suicidal thoughts 1  PHQ-9 Score 18      04/14/2022    3:13 PM  GAD 7 : Generalized Anxiety Score  Nervous, Anxious, on Edge 2  Control/stop worrying 2  Worry too much - different things 1  Trouble relaxing 3  Restless 3  Easily annoyed or irritable 3  Afraid - awful  might happen 0  Total GAD 7 Score 14            No data to display           Immunization History  Administered Date(s) Administered   Tdap 03/02/2018   No results found.  Past Medical History:  Diagnosis Date   Depression    Faint    Gastritis 09/07/2014   GERD (gastroesophageal reflux disease)    Irregular heartbeat    benign   Substance abuse in remission (HCC)    Allergies  Allergen Reactions   Doxycycline Diarrhea, Nausea And Vomiting and Rash   Past Surgical History:  Procedure Laterality Date   WISDOM TOOTH EXTRACTION     Family History  Problem Relation Age of Onset   Healthy Mother        Living/Living   Arthritis Father    Asthma Father    Bipolar disorder Father    Diabetes Father    Drug abuse Father    Diabetes Sister    Parkinson's disease Maternal Grandmother    COPD Maternal Grandfather    Arthritis Maternal Uncle        Hip Replacement   Social History   Social History Narrative   Marital status/children/pets: Married   Education/employment: School education.  Employed as a [employer] fast streamer:      -  smoke alarm in the home:Yes     - wears seatbelt: Yes     - Feels safe in their relationships: Yes       Allergies as of 04/14/2022       Reactions   Doxycycline Diarrhea, Nausea And Vomiting, Rash        Medication List        Accurate as of April 14, 2022  5:39 PM. If you have any questions, ask your nurse or doctor.          STOP taking these medications    acetaminophen 500 MG tablet Commonly known as: TYLENOL Stopped by: Felix Pacini, DO   cephALEXin 500 MG capsule Commonly known as: KEFLEX Stopped by: Felix Pacini, DO   cyclobenzaprine 10 MG tablet Commonly known as: FLEXERIL Stopped by: Felix Pacini, DO   EPINEPHrine 0.3 mg/0.3 mL Soaj injection Commonly known as: EPI-PEN Stopped by: Felix Pacini, DO   erythromycin ophthalmic ointment Stopped by: Felix Pacini, DO   ibuprofen 600 MG  tablet Commonly known as: ADVIL Stopped by: Felix Pacini, DO   promethazine 12.5 MG tablet Commonly known as: PHENERGAN Stopped by: Felix Pacini, DO       TAKE these medications    FLUoxetine 20 MG tablet Commonly known as: PROZAC Take 1 tablet (20 mg total) by mouth daily. Started by: Felix Pacini, DO   OLANZapine 2.5 MG tablet Commonly known as: ZYPREXA Take 1 tablet (2.5 mg total) by mouth at bedtime. Started by: Felix Pacini, DO        All past medical history, surgical history, allergies, family history, immunizations andmedications were updated in the EMR today and reviewed under the history and medication portions of their EMR.    No results found for this or any previous visit (from the past 2160 hour(s)).  No results found.   ROS 14 pt review of systems performed and negative (unless mentioned in an HPI)  Objective: BP 116/72   Pulse (!) 53   Temp (!) 97.5 F (36.4 C) (Oral)   Ht 5\' 7"  (1.702 m)   Wt 124 lb (56.2 kg)   SpO2 99%   BMI 19.42 kg/m  Physical Exam Vitals and nursing note reviewed.  Constitutional:      General: He is not in acute distress.    Appearance: Normal appearance. He is not ill-appearing, toxic-appearing or diaphoretic.     Comments: Thin  HENT:     Head: Normocephalic and atraumatic.  Eyes:     General: No scleral icterus.       Right eye: No discharge.        Left eye: No discharge.     Extraocular Movements: Extraocular movements intact.     Pupils: Pupils are equal, round, and reactive to light.  Neck:     Comments: No thyromegaly Cardiovascular:     Rate and Rhythm: Normal rate and regular rhythm.     Heart sounds: No murmur heard. Pulmonary:     Effort: Pulmonary effort is normal. No respiratory distress.     Breath sounds: Normal breath sounds. No wheezing or rhonchi.  Skin:    General: Skin is warm and dry.     Coloration: Skin is not jaundiced or pale.     Findings: No rash.  Neurological:     Mental  Status: He is alert and oriented to person, place, and time. Mental status is at baseline.  Psychiatric:        Attention and Perception: Attention  normal. He does not perceive auditory or visual hallucinations.        Mood and Affect: Mood normal. Affect is tearful.        Speech: Speech normal.        Behavior: Behavior is withdrawn. Behavior is cooperative.        Thought Content: Thought content normal. Thought content is not paranoid or delusional. Thought content does not include homicidal or suicidal ideation. Thought content does not include homicidal or suicidal plan.        Cognition and Memory: Cognition and memory normal.        Judgment: Judgment is impulsive.     Assessment/plan: MELDRICK BUTTERY is a 31 y.o. male present for patient establishment Establishing care with new doctor, encounter for Substance abuse in remission [facility]) Encouraged him to start NA meetings  Major depressive disorder, recurrent episode, mild (HCC) Start fluoxetine 20 mg daily Referral to to psychology urgently Patient encouraged to obtain emergent assistance if needed at 24-hour walk-in clinic  Mood disorder (HCC)/gambling disorder/sleep disorder Patient has never been formally diagnosed with a mental health disorder.  His father is bipolar.  Patient has struggled with substance abuse and has recently been able to wean himself off of opioids.  He struggles with gambling addiction.  Patient was encouraged to avoid triggers which are stopping at convenience stores on his way to work to gamble. Patient was encouraged to set a sleep and wake schedule setting alarms and sticking to them. Referral placed urgently to psychology Olanzapine 2.5 mg nightly   Follow-up in 2-3 weeks with tapering of medications at that time.   Return in about 18 days (around 05/02/2022).  Orders Placed This Encounter  Procedures   Ambulatory referral to [facility] Intensive OP Program   Meds ordered this encounter  Medications    FLUoxetine (PROZAC) 20 MG tablet    Sig: Take 1 tablet (20 mg total) by mouth daily.    Dispense:  90 tablet    Refill:  0   OLANZapine (ZYPREXA) 2.5 MG tablet    Sig: Take 1 tablet (2.5 mg total) by mouth at bedtime.    Dispense:  90 tablet    Refill:  0   Referral Orders         Ambulatory referral to [facility] Intensive OP Program       Note is dictated utilizing voice recognition software. Although note has been proof read prior to signing, occasional typographical errors still can be missed. If any questions arise, please do not hesitate to call for verification.  Electronically signed by: Felix Pacini, DO [facility] [place]

## 2022-04-14 NOTE — Patient Instructions (Signed)
No follow-ups on file.        Great to see you today.   If labs were collected, we will inform you of lab results once received either by echart message or telephone call.   - echart message- for normal results that have been seen by the patient already.   - telephone call: abnormal results or if patient has not viewed results in their echart.    

## 2022-04-26 DIAGNOSIS — S14109A Unspecified injury at unspecified level of cervical spinal cord, initial encounter: Secondary | ICD-10-CM | POA: Insufficient documentation

## 2022-04-29 DIAGNOSIS — T1490XA Injury, unspecified, initial encounter: Secondary | ICD-10-CM

## 2022-04-29 HISTORY — DX: Injury, unspecified, initial encounter: T14.90XA

## 2022-05-02 DIAGNOSIS — S36039A Unspecified laceration of spleen, initial encounter: Secondary | ICD-10-CM | POA: Insufficient documentation

## 2022-05-02 DIAGNOSIS — W378XXA Explosion and rupture of other pressurized tire, pipe or hose, initial encounter: Secondary | ICD-10-CM | POA: Insufficient documentation

## 2022-05-02 DIAGNOSIS — H7293 Unspecified perforation of tympanic membrane, bilateral: Secondary | ICD-10-CM | POA: Insufficient documentation

## 2022-05-02 DIAGNOSIS — S0292XA Unspecified fracture of facial bones, initial encounter for closed fracture: Secondary | ICD-10-CM | POA: Insufficient documentation

## 2022-05-02 HISTORY — DX: Explosion and rupture of other pressurized tire, pipe or hose, initial encounter: W37.8XXA

## 2022-05-04 ENCOUNTER — Ambulatory Visit (INDEPENDENT_AMBULATORY_CARE_PROVIDER_SITE_OTHER): Payer: 59 | Admitting: Family Medicine

## 2022-05-04 ENCOUNTER — Encounter: Payer: Self-pay | Admitting: Family Medicine

## 2022-05-04 VITALS — BP 116/74 | HR 90 | Ht 67.0 in | Wt 124.0 lb

## 2022-05-04 DIAGNOSIS — F4310 Post-traumatic stress disorder, unspecified: Secondary | ICD-10-CM

## 2022-05-04 DIAGNOSIS — S36039D Unspecified laceration of spleen, subsequent encounter: Secondary | ICD-10-CM | POA: Diagnosis not present

## 2022-05-04 DIAGNOSIS — F330 Major depressive disorder, recurrent, mild: Secondary | ICD-10-CM | POA: Diagnosis not present

## 2022-05-04 LAB — CBC WITH DIFFERENTIAL/PLATELET
Basophils Absolute: 0.1 10*3/uL (ref 0.0–0.1)
Basophils Relative: 0.5 % (ref 0.0–3.0)
Eosinophils Absolute: 0.3 10*3/uL (ref 0.0–0.7)
Eosinophils Relative: 2.4 % (ref 0.0–5.0)
HCT: 40.4 % (ref 39.0–52.0)
Hemoglobin: 13.4 g/dL (ref 13.0–17.0)
Lymphocytes Relative: 16.6 % (ref 12.0–46.0)
Lymphs Abs: 2.2 10*3/uL (ref 0.7–4.0)
MCHC: 33.1 g/dL (ref 30.0–36.0)
MCV: 88 fl (ref 78.0–100.0)
Monocytes Absolute: 1.1 10*3/uL — ABNORMAL HIGH (ref 0.1–1.0)
Monocytes Relative: 8.4 % (ref 3.0–12.0)
Neutro Abs: 9.4 10*3/uL — ABNORMAL HIGH (ref 1.4–7.7)
Neutrophils Relative %: 72.1 % (ref 43.0–77.0)
Platelets: 331 10*3/uL (ref 150.0–400.0)
RBC: 4.59 Mil/uL (ref 4.22–5.81)
RDW: 13.3 % (ref 11.5–15.5)
WBC: 13 10*3/uL — ABNORMAL HIGH (ref 4.0–10.5)

## 2022-05-04 MED ORDER — CHLORHEXIDINE GLUCONATE 0.12 % MT SOLN: 15.0000 mL | Freq: Two times a day (BID) | OROMUCOSAL | 5 refills | Status: DC

## 2022-05-04 MED ORDER — FLUOXETINE HCL 20 MG PO TABS: 40.0000 mg | ORAL_TABLET | Freq: Every day | ORAL | 1 refills | Status: DC

## 2022-05-04 MED ORDER — OLANZAPINE 5 MG PO TABS: 5.0000 mg | ORAL_TABLET | Freq: Every day | ORAL | 1 refills | Status: DC

## 2022-05-04 NOTE — Progress Notes (Unsigned)
Patient ID: Wesley Hartman, male  DOB: Sep 01, 1990, 31 y.o.   MRN: [id number] Patient Care Team    Relationship Specialty Notifications Start End  Natalia Leatherwood, DO PCP - General Family Medicine  04/14/22     Chief Complaint  Patient presents with   Depression    [facility]; pt states sx are same due to accident and dx with PTSD; referral pending     Subjective:  Wesley Hartman is a 31 y.o.  male present for follow-up on his depression. He started the Prozac and Zyprexa 2.5 mg nightly.  His significant other is with him today and provides much of the information since patient has had a rather severe injury recently and is unable to move his jaw comfortably.  He is also unable to hear.  Patient's significant other reports that he was doing fairly well and they could see some improvement with the start of medication.  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  Patient sustained facial fractures, liver laceration, spleen laceration, kidney laceration.  He has been hospitalized and recently discharged with appropriate appointment set up for surgery.  Now that he has had such significant trauma, he does not feel the medications are working as effectively. They do report he is having PTSD-like features, flashbacks and panic surrounding his recent trauma. Prior note: Patient presents with his significant other today to discuss his depression.  He reports he recently has struggled with opioid addiction.  He did not receive treatment, but was able to come off opioid on his own and has been clean from that standpoint.  He states he has noticed he has worsening depression.  He has feelings of guilt surrounding what he put himself and his family through.  He has a family history of manic depressive/bipolar in his father. Wesley Hartman has never been on medications in the past for mental health.  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  His girlfriend states he can be very  impulsive, spending entire checks gambling.    05/04/2022   10:15 AM 04/14/2022    3:13 PM  Depression screen PHQ 2/9  Decreased Interest 1 2  Down, Depressed, Hopeless 2 2  PHQ - 2 Score 3 4  Altered sleeping 2 3  Tired, decreased energy 2 3  Change in appetite 1 2  Feeling bad or failure about yourself  1 3  Trouble concentrating 0 0  Moving slowly or fidgety/restless 2 2  Suicidal thoughts 0 1  PHQ-9 Score 11 18      05/04/2022   10:16 AM 04/14/2022    3:13 PM  GAD 7 : Generalized Anxiety Score  Nervous, Anxious, on Edge 1 2  Control/stop worrying 2 2  Worry too much - different things 2 1  Trouble relaxing 2 3  Restless 2 3  Easily annoyed or irritable 2 3  Afraid - awful might happen 1 0  Total GAD 7 Score 12 14            No data to display           Immunization History  Administered Date(s) Administered   Tdap 03/02/2018   No results found.  Past Medical History:  Diagnosis Date   Depression    Faint    Gastritis 09/07/2014   GERD (gastroesophageal reflux disease)    Irregular heartbeat    benign   Substance abuse in remission (HCC)    Allergies  Allergen Reactions   Doxycycline Diarrhea, Nausea And Vomiting and Rash   Past Surgical History:  Procedure Laterality Date   WISDOM TOOTH EXTRACTION     Family History  Problem Relation Age of Onset   Healthy Mother        Living/Living   Arthritis Father    Asthma Father    Bipolar disorder Father    Diabetes Father    Drug abuse Father    Diabetes Sister    Parkinson's disease Maternal Grandmother    COPD Maternal Grandfather    Arthritis Maternal Uncle        Hip Replacement   Social History   Social History Narrative   Marital status/children/pets: Married   Education/employment: School education.  Employed as a [employer] fast streamer:      -smoke alarm in the home:Yes     - wears seatbelt: Yes     - Feels safe in their relationships: Yes       Allergies as of 05/04/2022        Reactions   Doxycycline Diarrhea, Nausea And Vomiting, Rash        Medication List        Accurate as of May 04, 2022 11:59 PM. If you have any questions, ask your nurse or doctor.          celecoxib 100 MG capsule Commonly known as: CELEBREX Take by mouth.   chlorhexidine 0.12 % solution Commonly known as: PERIDEX Use as directed 15 mLs in the mouth or throat 2 (two) times daily. Started by: Felix Pacini, DO   ciprofloxacin-dexamethasone OTIC suspension Commonly known as: CIPRODEX Place 4 drops into both ears 2 times daily for 14 days.   FLUoxetine 20 MG tablet Commonly known as: PROZAC Take 2 tablets (40 mg total) by mouth daily. What changed: how much to take Changed by: Felix Pacini, DO   gabapentin 300 MG capsule Commonly known as: NEURONTIN Take by mouth.   HYDROcodone-acetaminophen 5-325 MG tablet Commonly known as: NORCO/VICODIN Take by mouth.   methocarbamol 500 MG tablet Commonly known as: ROBAXIN Take by mouth.   OLANZapine 5 MG tablet Commonly known as: ZYPREXA Take 1 tablet (5 mg total) by mouth at bedtime. What changed:  medication strength how much to take Changed by: Felix Pacini, DO   polyethylene glycol 17 g packet Commonly known as: MIRALAX / GLYCOLAX Take by mouth.   senna-docusate 8.6-50 MG tablet Commonly known as: Senokot-S Take 2 tablets by mouth at bedtime.   traMADol HCl 100 MG Tabs Take by mouth.        All past medical history, surgical history, allergies, family history, immunizations andmedications were updated in the EMR today and reviewed under the history and medication portions of their EMR.    Recent Results (from the past 2160 hour(s))  CBC w/Diff     Status: Abnormal   Collection Time: 05/04/22 10:41 AM  Result Value Ref Range   WBC 13.0 (H) 4.0 - 10.5 K/uL   RBC 4.59 4.22 - 5.81 Mil/uL   Hemoglobin 13.4 13.0 - 17.0 g/dL   HCT 73.4 19.3 - 79.0 %   MCV 88.0 78.0 - 100.0 fl   MCHC 33.1 30.0 - 36.0  g/dL   RDW 24.0 97.3 - 53.2 %   Platelets 331.0 150.0 - 400.0 K/uL   Neutrophils Relative % 72.1 43.0 - 77.0 %   Lymphocytes Relative 16.6 12.0 - 46.0 %   Monocytes Relative 8.4 3.0 - 12.0 %  Eosinophils Relative 2.4 0.0 - 5.0 %   Basophils Relative 0.5 0.0 - 3.0 %   Neutro Abs 9.4 (H) 1.4 - 7.7 K/uL   Lymphs Abs 2.2 0.7 - 4.0 K/uL   Monocytes Absolute 1.1 (H) 0.1 - 1.0 K/uL   Eosinophils Absolute 0.3 0.0 - 0.7 K/uL   Basophils Absolute 0.1 0.0 - 0.1 K/uL    No results found.   ROS 14 pt review of systems performed and negative (unless mentioned in an HPI)  Objective: BP 116/74   Pulse 90   Ht 5\' 7"  (1.702 m)   Wt 124 lb (56.2 kg)   SpO2 98%   BMI 19.42 kg/m  Physical Exam Vitals and nursing note reviewed. Exam conducted with a chaperone present.  Constitutional:      General: He is not in acute distress.    Appearance: Normal appearance. He is not ill-appearing, toxic-appearing or diaphoretic.  HENT:     Head: Normocephalic and atraumatic.     Comments: Bilateral ruptured tympanic membranes-thus unable to hear today.   Eyes:     General: No scleral icterus.       Right eye: No discharge.        Left eye: No discharge.     Extraocular Movements: Extraocular movements intact.     Pupils: Pupils are equal, round, and reactive to light.  Musculoskeletal:        General: Signs of injury present.  Skin:    General: Skin is warm and dry.     Coloration: Skin is not jaundiced or pale.     Findings: No rash.  Neurological:     Mental Status: He is alert and oriented to person, place, and time. Mental status is at baseline.  Psychiatric:        Mood and Affect: Mood normal.        Behavior: Behavior normal.        Thought Content: Thought content normal.        Judgment: Judgment normal.     Assessment/plan: Wesley Hartman is a 31 y.o. male present for  Major depressive disorder, recurrent episode, mild (HCC)/PTSD Increase fluoxetine 40 mg daily Referral to to  psychiatry placed urgently, especially to cover new PTSD features after his recent accident. Patient encouraged to obtain emergent assistance if needed at 24-hour walk-in clinic  Mood disorder (HCC)/gambling disorder/sleep disorder Patient has never been formally diagnosed with a mental health disorder.  His father is bipolar.   Patient admitted he had struggled with substance abuse and was able to wean himself off opioids, however he has had his recent trauma and has been placed back on opioids for pain control.  Opioids would be appropriate in this case, however we did discuss he will have to be cautious considering his history of addiction.   He has also struggled with gambling addiction Patient was encouraged to set a sleep and wake schedule setting alarms and sticking to them. Increase fluoxetine 40 mg daily Increase Zyprexa to 5 mg nightly  Dizziness: CBC.  Patient became dizzy during discussion today.     Return in about 3 months (around 07/26/2022).  Orders Placed This Encounter  Procedures   CBC w/Diff   Ambulatory referral to Psychiatry   Meds ordered this encounter  Medications   FLUoxetine (PROZAC) 20 MG tablet    Sig: Take 2 tablets (40 mg total) by mouth daily.    Dispense:  180 tablet    Refill:  1  OLANZapine (ZYPREXA) 5 MG tablet    Sig: Take 1 tablet (5 mg total) by mouth at bedtime.    Dispense:  90 tablet    Refill:  1   chlorhexidine (PERIDEX) 0.12 % solution    Sig: Use as directed 15 mLs in the mouth or throat 2 (two) times daily.    Dispense:  120 mL    Refill:  5   Referral Orders         Ambulatory referral to Psychiatry        Note is dictated utilizing voice recognition software. Although note has been proof read prior to signing, occasional typographical errors still can be missed. If any questions arise, please do not hesitate to call for verification.  Electronically signed by: Felix Pacini, DO [facility] [place]

## 2022-05-15 ENCOUNTER — Telehealth: Payer: Self-pay | Admitting: Family Medicine

## 2022-05-15 NOTE — Telephone Encounter (Signed)
Referral number given to pt to [facility]

## 2022-05-15 NOTE — Telephone Encounter (Signed)
Patient has been referred to psychiatry, secondary to request at his last appointment. His first appointment we had referred him to psychology.  When psychiatry called to schedule he stated that he did not need med management and only was looking for therapy.  Please check into the psychology referral to see if he has been scheduled.  If not, then we need to make sure he gets referred to a therapist. In place new referral for therapy.  Not at the [facility] location because they do not have a therapist (that is where the last referral went).

## 2022-05-28 ENCOUNTER — Other Ambulatory Visit: Payer: Self-pay | Admitting: Family Medicine

## 2022-07-21 ENCOUNTER — Other Ambulatory Visit: Payer: Self-pay | Admitting: Family Medicine

## 2022-08-03 ENCOUNTER — Ambulatory Visit (INDEPENDENT_AMBULATORY_CARE_PROVIDER_SITE_OTHER): Payer: 59 | Admitting: Family Medicine

## 2022-08-03 ENCOUNTER — Encounter: Payer: Self-pay | Admitting: Family Medicine

## 2022-08-03 VITALS — BP 96/59 | HR 65 | Temp 97.7°F | Ht 67.0 in | Wt 128.0 lb

## 2022-08-03 DIAGNOSIS — F330 Major depressive disorder, recurrent, mild: Secondary | ICD-10-CM | POA: Diagnosis not present

## 2022-08-03 DIAGNOSIS — F39 Unspecified mood [affective] disorder: Secondary | ICD-10-CM

## 2022-08-03 DIAGNOSIS — G479 Sleep disorder, unspecified: Secondary | ICD-10-CM

## 2022-08-03 MED ORDER — FLUOXETINE HCL 60 MG PO TABS: 60.0000 mg | ORAL_TABLET | Freq: Every day | ORAL | 1 refills | Status: DC

## 2022-08-03 MED ORDER — OLANZAPINE 5 MG PO TABS: 5.0000 mg | ORAL_TABLET | Freq: Every day | ORAL | 1 refills | Status: DC

## 2022-08-03 NOTE — Progress Notes (Signed)
Patient ID: Wesley Hartman, male  DOB: 02-15-1991, 31 y.o.   MRN: [id number] Patient Care Team    Relationship Specialty Notifications Start End  Natalia Leatherwood, DO PCP - General Family Medicine  04/14/22     Chief Complaint  Patient presents with   Depression    [facility]; pt is not fasting     Subjective:  Wesley Hartman is a 31 y.o.  male present for follow-up on his depression/mood disorder. He reports overall he is feeling well on medications. He is still a little irritable and believes he could use a higher dose on his prozac. He is compliant with prozac 40 mg qd and zyprexa 5 mg qhs. He is healing well from his injuries.  Prior  note: He started the Prozac and Zyprexa 2.5 mg nightly.  His significant other is with him today and provides much of the information since patient has had a rather severe injury recently and is unable to move his jaw comfortably.  He is also unable to hear.  Patient's significant other reports that he was doing fairly well and they could see some improvement with the start of medication.  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  Patient sustained facial fractures, liver laceration, spleen laceration, kidney laceration.  He has been hospitalized and recently discharged with appropriate appointment set up for surgery.  Now that he has had such significant trauma, he does not feel the medications are working as effectively. They do report he is having PTSD-like features, flashbacks and panic surrounding his recent trauma. Prior note: Patient presents with his significant other today to discuss his depression.  He reports he recently has struggled with opioid addiction.  He did not receive treatment, but was able to come off opioid on his own and has been clean from that standpoint.  He states he has noticed he has worsening depression.  He has feelings of guilt surrounding what he put himself and his family through.  He has a  family history of manic depressive/bipolar in his father. Wesley Hartman has never been on medications in the past for mental health.  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  His girlfriend states he can be very impulsive, spending entire checks gambling.     08/03/2022    9:46 AM 05/04/2022   10:15 AM 04/14/2022    3:13 PM  Depression screen PHQ 2/9  Decreased Interest 0 1 2  Down, Depressed, Hopeless 1 2 2   PHQ - 2 Score 1 3 4   Altered sleeping 0 2 3  Tired, decreased energy 0 2 3  Change in appetite 1 1 2   Feeling bad or failure about yourself  0 1 3  Trouble concentrating 0 0 0  Moving slowly or fidgety/restless 0 2 2  Suicidal thoughts 0 0 1  PHQ-9 Score 2 11 18       08/03/2022    9:46 AM 05/04/2022   10:16 AM 04/14/2022    3:13 PM  GAD 7 : Generalized Anxiety Score  Nervous, Anxious, on Edge 0 1 2  Control/stop worrying 0 2 2  Worry too much - different things 1 2 1   Trouble relaxing 0 2 3  Restless 0 2 3  Easily annoyed or irritable 0 2 3  Afraid - awful might happen 0 1 0  Total GAD 7 Score 1 12 14             No  data to display           Immunization History  Administered Date(s) Administered   Tdap 03/02/2018   No results found.  Past Medical History:  Diagnosis Date   Depression    Faint    Gastritis 09/07/2014   GERD (gastroesophageal reflux disease)    Irregular heartbeat    benign   Substance abuse in remission (HCC)    Allergies  Allergen Reactions   Doxycycline Diarrhea, Nausea And Vomiting and Rash   Past Surgical History:  Procedure Laterality Date   WISDOM TOOTH EXTRACTION     Family History  Problem Relation Age of Onset   Healthy Mother        Living/Living   Arthritis Father    Asthma Father    Bipolar disorder Father    Diabetes Father    Drug abuse Father    Diabetes Sister    Parkinson's disease Maternal Grandmother    COPD Maternal Grandfather    Arthritis Maternal Uncle        Hip Replacement   Social History    Social History Narrative   Marital status/children/pets: Married   Education/employment: School education.  Employed as a [employer] fast streamer:      -smoke alarm in the home:Yes     - wears seatbelt: Yes     - Feels safe in their relationships: Yes       Allergies as of 08/03/2022       Reactions   Doxycycline Diarrhea, Nausea And Vomiting, Rash        Medication List        Accurate as of August 03, 2022  9:59 AM. If you have any questions, ask your nurse or doctor.          STOP taking these medications    chlorhexidine 0.12 % solution Commonly known as: PERIDEX Stopped by: Felix Pacini, DO   gabapentin 300 MG capsule Commonly known as: NEURONTIN Stopped by: Felix Pacini, DO   polyethylene glycol 17 g packet Commonly known as: MIRALAX / GLYCOLAX Stopped by: Felix Pacini, DO   senna-docusate 8.6-50 MG tablet Commonly known as: Senokot-S Stopped by: Felix Pacini, DO       TAKE these medications    FLUoxetine HCl 60 MG Tabs Take 60 mg by mouth daily. What changed:  medication strength how much to take Changed by: Felix Pacini, DO   OLANZapine 5 MG tablet Commonly known as: ZYPREXA Take 1 tablet (5 mg total) by mouth at bedtime.        All past medical history, surgical history, allergies, family history, immunizations andmedications were updated in the EMR today and reviewed under the history and medication portions of their EMR.    No results found for this or any previous visit (from the past 2160 hour(s)).   No results found.   ROS 14 pt review of systems performed and negative (unless mentioned in an HPI)  Objective: BP (!) 96/59   Pulse 65   Temp 97.7 F (36.5 C) (Oral)   Ht 5\' 7"  (1.702 m)   Wt 128 lb (58.1 kg)   SpO2 100%   BMI 20.05 kg/m  Physical Exam Vitals and nursing note reviewed.  Constitutional:      General: He is not in acute distress.    Appearance: Normal appearance. He is not ill-appearing, toxic-appearing  or diaphoretic.  HENT:     Head: Normocephalic and atraumatic.  Eyes:     General: No  scleral icterus.       Right eye: No discharge.        Left eye: No discharge.     Extraocular Movements: Extraocular movements intact.     Pupils: Pupils are equal, round, and reactive to light.  Skin:    General: Skin is warm and dry.     Coloration: Skin is not jaundiced or pale.     Findings: No rash.  Neurological:     Mental Status: He is alert and oriented to person, place, and time. Mental status is at baseline.  Psychiatric:        Attention and Perception: Attention and perception normal.        Mood and Affect: Mood normal.        Behavior: Behavior normal.        Thought Content: Thought content normal.        Cognition and Memory: Cognition and memory normal.        Judgment: Judgment normal.     Assessment/plan: MACEDONIO SCALLON is a 31 y.o. male present for  Major depressive disorder, recurrent episode, mild (HCC)/PTSD increase fluoxetine 40 mg> 60 mg daily Referral to to psychology  placed   Mood disorder (HCC)/gambling disorder/sleep disorder Patient has never been formally diagnosed with a mental health disorder.  His father is bipolar.   Patient admitted he had struggled with substance abuse and was able to wean himself off opioids, however he has had his recent trauma and has been placed back on opioids for pain control.  Opioids would be appropriate in this case, however we did discuss he will have to be cautious considering his history of addiction.   He has also struggled with gambling addiction Patient was encouraged to set a sleep and wake schedule setting alarms and sticking to them. increase fluoxetine 60 mg daily Continue Zyprexa to 5 mg nightly   Return in about 24 weeks (around 01/18/2023).  Orders Placed This Encounter  Procedures   Ambulatory referral to Psychology   Meds ordered this encounter  Medications   FLUoxetine 60 MG TABS    Sig: Take 60 mg by  mouth daily.    Dispense:  90 tablet    Refill:  1   OLANZapine (ZYPREXA) 5 MG tablet    Sig: Take 1 tablet (5 mg total) by mouth at bedtime.    Dispense:  90 tablet    Refill:  1   Referral Orders         Ambulatory referral to Psychology       Note is dictated utilizing voice recognition software. Although note has been proof read prior to signing, occasional typographical errors still can be missed. If any questions arise, please do not hesitate to call for verification.  Electronically signed by: Felix Pacini, DO [facility] [place]

## 2022-08-03 NOTE — Patient Instructions (Signed)
Return in about 24 weeks (around 01/18/2023).        Great to see you today.  I have refilled the medication(s) we provide.   If labs were collected, we will inform you of lab results once received either by echart message or telephone call.   - echart message- for normal results that have been seen by the patient already.   - telephone call: abnormal results or if patient has not viewed results in their echart.

## 2022-08-29 ENCOUNTER — Telehealth: Payer: Self-pay

## 2022-08-29 NOTE — Telephone Encounter (Signed)
Pt informed 6 mos sent during last OV.

## 2022-08-29 NOTE — Telephone Encounter (Signed)
Patient wife, Candace [facility]) calling about problem with prescription.  Pharmacy is telling patient approval needed by provider.  Please call Candace at [phone]   [facility] [place]  FLUoxetine 60 MG TABS

## 2022-08-30 ENCOUNTER — Telehealth: Payer: Self-pay | Admitting: Family Medicine

## 2022-08-30 NOTE — Telephone Encounter (Signed)
Pts wife called and states they are having a hard time with the Fluoxetine 60 mg being filled at [facility]. She reports they filled the medication at 20 mg, but instructed to three so it would not even be a 30 day supply at this point.  Please contact patient regarding this.

## 2022-08-31 NOTE — Telephone Encounter (Signed)
LM for pt to return call to discuss.  

## 2022-09-01 NOTE — Telephone Encounter (Signed)
LVM for pt to CB regarding medication.   Note: Please clarify issue of medication.

## 2022-09-04 MED ORDER — FLUOXETINE HCL 20 MG PO TABS: 60.0000 mg | ORAL_TABLET | Freq: Every day | ORAL | 1 refills | Status: DC

## 2022-09-04 NOTE — Telephone Encounter (Signed)
Pt states that ins denying the 60 mg tab but will (3) 20 mg tabs instead. Please advise on change of script.

## 2022-09-25 ENCOUNTER — Telehealth: Payer: Self-pay

## 2022-09-25 DIAGNOSIS — F39 Unspecified mood [affective] disorder: Secondary | ICD-10-CM

## 2022-09-25 DIAGNOSIS — F330 Major depressive disorder, recurrent, mild: Secondary | ICD-10-CM

## 2022-09-25 NOTE — Telephone Encounter (Signed)
Wife, Myton, ([place]) calling about referral to [facility] for patient.  Patient was notified that worker's comp was not going to cover bills.  At the beginning, they were going to cover them.  Referral has been closed. Patient is requesting to re-enter, send referral again to [facility].  Please contact Candice for appt information. She easier to reach her than patient during business hours because of his job.

## 2022-09-25 NOTE — Telephone Encounter (Signed)
Referral resent

## 2022-09-25 NOTE — Telephone Encounter (Signed)
Please advise if ok to re-enter referral.

## 2022-09-25 NOTE — Addendum Note (Signed)
Addended by: Kavin Leech on: 09/25/2022 04:26 PM   Modules accepted: Orders

## 2022-09-25 NOTE — Telephone Encounter (Signed)
Ok to refer if pt desires.

## 2022-10-26 ENCOUNTER — Other Ambulatory Visit: Payer: Self-pay

## 2022-10-31 ENCOUNTER — Ambulatory Visit (INDEPENDENT_AMBULATORY_CARE_PROVIDER_SITE_OTHER): Payer: 59 | Admitting: Psychology

## 2022-10-31 DIAGNOSIS — F330 Major depressive disorder, recurrent, mild: Secondary | ICD-10-CM
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
## 2022-11-02 NOTE — Addendum Note (Signed)
Addended by: Buena Irish on: 11/02/2022 10:17 AM   Modules accepted: Level of Service

## 2022-11-07 ENCOUNTER — Ambulatory Visit (INDEPENDENT_AMBULATORY_CARE_PROVIDER_SITE_OTHER): Payer: 59 | Admitting: Psychology

## 2022-11-07 DIAGNOSIS — F330 Major depressive disorder, recurrent, mild: Secondary | ICD-10-CM | POA: Diagnosis not present

## 2022-11-07 NOTE — Progress Notes (Signed)
[facility] Counselor/Therapist Progress Note  Patient ID: JAELYNN HAILEY, MRN: [id number]   Date: 11/07/22  Time Spent: 10:07  am - 10:52 am : 45 Minutes  Treatment Type: Individual Therapy.  Reported Symptoms: depression and anxiety.   Mental Status Exam: Appearance:  Bizarre     Behavior: Appropriate  Motor: Normal  Speech/Language:  Clear and Coherent  Affect: Congruent  Mood: dysthymic  Thought process: normal  Thought content:   WNL  Sensory/Perceptual disturbances:   WNL  Orientation: oriented to person, place, time/date, and situation  Attention: Good  Concentration: Good  Memory: WNL  Fund of knowledge:  Good  Insight:   Good  Judgment:  Good  Impulse Control: Good   Risk Assessment: Danger to Self:  No Self-injurious Behavior: No Danger to Others: No Duty to Warn:no Physical Aggression / Violence:No  Access to Firearms a concern: No  Gang Involvement:No   Subjective:   FAISON OPHEIM participated from home, via video and consented to treatment. Therapist participated from home office. We met online due to [place] pandemic. Stefon reviewed the events of the past week. We reviewed numerous treatment approaches including CBT, BA, Problem Solving, and Solution focused therapy. Psych-education regarding the Waymond's diagnosis of Major depressive disorder, recurrent episode, mild ([place]) was provided during the session. We discussed MURAD PAMPHILE goals treatment goals which include symptoms, process past events, bolster coping skills, eliminate online betting behavior,  maintain sobriety, address recent traumatic event.  [name] provided verbal approval of the treatment plan. Goals include maintaining sobriety and discontinuing online better. His mother has a history of gambling with significant loss of funds but has discontinued. He noted his coping including taking a walk and working on distracting self. Therapist provided Maylon Cos with handout for  mindfulness, via email, and requested that Jake log his thoughts, feeling, and physical sensations once per day.   Interventions: Psycho-education & Goal Setting.   Diagnosis:  Major depressive disorder, recurrent episode, mild ([place])  Psychiatric Treatment: Yes , Pending referral to [facility].    Treatment Plan:  Client Abilities/Strengths Vuong is motivated for change.   Support System: Family.   Client Treatment Preferences Outpatient Therapy.   Client Statement of Needs Farmer would like to manage symptoms, process past events, bolster coping skills, eliminate online betting behavior,  maintain sobriety, address recent traumatic event, improve frustration tolerance.    Treatment Level Weekly  Symptoms  Anxiety: Feeling nervous and on edge, difficulty managing worry, worrying about different things, trouble relaxing, restlessness, irritability, feeling afraid something awful might happen   (Status: maintained) Depression: loss of interest, feeling down, difficulty with sleep  lethargy, poor appetite (due to dental/facial pain), feeling bad about self, trouble concentrating, and psycho-motor retardation).   (Status: maintained)  Goals:   Dent experiences symptoms of depression & anxiety.    Target Date: 11/07/23 Frequency: Weekly  Progress: 0 Modality: individual    Therapist will provide referrals for additional resources as appropriate.  Therapist will provide psycho-education regarding Aayan's diagnosis and corresponding treatment approaches and interventions. Licensed Clinical Social Worker, [place], LCSW will support the patient's ability to achieve the goals identified. will employ CBT, BA, Problem-solving, Solution Focused, Mindfulness,  coping skills, & other evidenced-based practices will be used to promote progress towards healthy functioning to help manage decrease symptoms associated with his diagnosis.   Reduce overall level, frequency, and  intensity of the feelings of depression, anxiety and panic evidenced by decreased overall symptoms from 6 to  7 days/week to 0 to 1 days/week per client report for at least 3 consecutive months. Verbally express understanding of the relationship between feelings of depression, anxiety and their impact on thinking patterns and behaviors. Verbalize an understanding of the role that distorted thinking plays in creating fears, excessive worry, and ruminations.    Edison Nasuti participated in the creation of the treatment plan)    Buena Irish, LCSW

## 2022-11-15 ENCOUNTER — Encounter: Payer: 59 | Admitting: Psychology

## 2022-11-15 DIAGNOSIS — F330 Major depressive disorder, recurrent, mild: Secondary | ICD-10-CM

## 2022-11-15 NOTE — Progress Notes (Signed)
This encounter was created in error - please disregard.

## 2022-11-24 ENCOUNTER — Ambulatory Visit: Payer: 59 | Admitting: Psychology

## 2022-11-29 ENCOUNTER — Encounter: Payer: 59 | Admitting: Psychology

## 2022-11-29 NOTE — Progress Notes (Signed)
This encounter was created in error - please disregard.

## 2022-12-06 ENCOUNTER — Ambulatory Visit: Payer: 59 | Admitting: Psychology

## 2022-12-13 ENCOUNTER — Encounter: Payer: 59 | Admitting: Psychology

## 2022-12-13 NOTE — Progress Notes (Signed)
This encounter was created in error - please disregard.

## 2023-01-17 ENCOUNTER — Ambulatory Visit (INDEPENDENT_AMBULATORY_CARE_PROVIDER_SITE_OTHER): Payer: 59 | Admitting: Family Medicine

## 2023-01-17 VITALS — BP 104/69 | HR 73 | Temp 97.5°F | Wt 124.4 lb

## 2023-01-17 DIAGNOSIS — L918 Other hypertrophic disorders of the skin: Secondary | ICD-10-CM | POA: Diagnosis not present

## 2023-01-17 MED ORDER — MUPIROCIN 2 % EX OINT: 1.0000 | TOPICAL_OINTMENT | Freq: Two times a day (BID) | CUTANEOUS | 0 refills | Status: AC

## 2023-01-17 NOTE — Patient Instructions (Addendum)
Return in about 12 days (around 01/29/2023) for mole removal 1140 appt please.        Great to see you today.  I have refilled the medication(s) we provide.   If labs were collected, we will inform you of lab results once received either by echart message or telephone call.   - echart message- for normal results that have been seen by the patient already.   - telephone call: abnormal results or if patient has not viewed results in their echart.

## 2023-01-17 NOTE — Progress Notes (Signed)
Wesley Hartman , 09/17/1990, 32 y.o., male MRN: [id number] Patient Care Team    Relationship Specialty Notifications Start End  Natalia Leatherwood, DO PCP - General Family Medicine  04/14/22     Chief Complaint  Patient presents with   Skin Problem    Pt has a mole that has been causing pain. Mole has been bleeding when scratched. Mole has always been present.      Subjective: Wesley Hartman is a 32 y.o. Pt presents for an OV with complaints of irritated skin lesion of 1 week duration.  Associated symptoms include bleeding and painful. He reports he has had the skin lesion ever since he can remember. It frequently becomes caught on clothing, becomes tender and sometimes bleeds.      01/17/2023    1:50 PM 08/03/2022    9:46 AM 05/04/2022   10:15 AM 04/14/2022    3:13 PM  Depression screen PHQ 2/9  Decreased Interest 0 0 1 2  Down, Depressed, Hopeless 1 1 2 2   PHQ - 2 Score 1 1 3 4   Altered sleeping 1 0 2 3  Tired, decreased energy 1 0 2 3  Change in appetite 2 1 1 2   Feeling bad or failure about yourself  1 0 1 3  Trouble concentrating 0 0 0 0  Moving slowly or fidgety/restless 0 0 2 2  Suicidal thoughts 0 0 0 1  PHQ-9 Score 6 2 11 18   Difficult doing work/chores Not difficult at all       Allergies  Allergen Reactions   Doxycycline Diarrhea, Nausea And Vomiting and Rash   Social History   Social History Narrative   Marital status/children/pets: Married   Education/employment: School education.  Employed as a Civil Service fast streamer:      -smoke alarm in the home:Yes     - wears seatbelt: Yes     - Feels safe in their relationships: Yes      Past Medical History:  Diagnosis Date   Depression    Faint    Gastritis 09/07/2014   GERD (gastroesophageal reflux disease)    Irregular heartbeat    benign   Substance abuse in remission [facility])    Past Surgical History:  Procedure Laterality Date   WISDOM TOOTH EXTRACTION     Family History  Problem Relation Age of  Onset   Healthy Mother        Living/Living   Arthritis Father    Asthma Father    Bipolar disorder Father    Diabetes Father    Drug abuse Father    Diabetes Sister    Parkinson's disease Maternal Grandmother    COPD Maternal Grandfather    Arthritis Maternal Uncle        Hip Replacement   Allergies as of 01/17/2023       Reactions   Doxycycline Diarrhea, Nausea And Vomiting, Rash        Medication List        Accurate as of Jan 17, 2023  3:19 PM. If you have any questions, ask your nurse or doctor.          FLUoxetine 20 MG tablet Commonly known as: PROZAC Take 3 tablets (60 mg total) by mouth daily.   mupirocin ointment 2 % Commonly known as: BACTROBAN Apply 1 Application topically 2 (two) times daily for 14 days.   OLANZapine 5 MG tablet Commonly known as: ZYPREXA Take 1 tablet (5  mg total) by mouth at bedtime.        All past medical history, surgical history, allergies, family history, immunizations andmedications were updated in the EMR today and reviewed under the history and medication portions of their EMR.     ROS Negative, with the exception of above mentioned in HPI   Objective:  BP 104/69   Pulse 73   Temp (!) 97.5 F (36.4 C)   Wt 124 lb 6.4 oz (56.4 kg)   SpO2 94%   BMI 19.48 kg/m  Body mass index is 19.48 kg/m. Physical Exam Vitals and nursing note reviewed.  Constitutional:      General: He is not in acute distress.    Appearance: Normal appearance. He is not ill-appearing, toxic-appearing or diaphoretic.  HENT:     Head: Normocephalic and atraumatic.  Eyes:     General: No scleral icterus.       Right eye: No discharge.        Left eye: No discharge.     Extraocular Movements: Extraocular movements intact.     Pupils: Pupils are equal, round, and reactive to light.  Skin:    General: Skin is warm and dry.     Coloration: Skin is not jaundiced or pale.     Findings: Lesion (thick skin tag with irritation surrounding  located over left scapula.) present. No rash.  Neurological:     Mental Status: He is alert and oriented to person, place, and time. Mental status is at baseline.  Psychiatric:        Mood and Affect: Mood normal.        Behavior: Behavior normal.        Thought Content: Thought content normal.        Judgment: Judgment normal.      No results found. No results found. No results found for this or any previous visit (from the past 24 hour(s)).  Assessment/Plan: Wesley Hartman is a 32 y.o. male present for OV for  Skin tag Bactroban BID for 14 days.  Schedule skin lesion removal for shave biopsy within next 1-2 weeks.    Reviewed expectations re: course of current medical issues. Discussed self-management of symptoms. Outlined signs and symptoms indicating need for more acute intervention. Patient verbalized understanding and all questions were answered. Patient received an After-Visit Summary.    No orders of the defined types were placed in this encounter.  Meds ordered this encounter  Medications   mupirocin ointment (BACTROBAN) 2 %    Sig: Apply 1 Application topically 2 (two) times daily for 14 days.    Dispense:  22 g    Refill:  0   Referral Orders  No referral(s) requested today     Note is dictated utilizing voice recognition software. Although note has been proof read prior to signing, occasional typographical errors still can be missed. If any questions arise, please do not hesitate to call for verification.   electronically signed by:  Felix Pacini, DO  [facility] [place]

## 2023-01-18 ENCOUNTER — Encounter: Payer: 59 | Admitting: Family Medicine

## 2023-01-18 NOTE — Progress Notes (Signed)
   Complete physical exam  Patient: Wesley Hartman   DOB: 06/17/1997   32 y.o. Male  MRN: [id number]  Subjective:    No chief complaint on file.   Wesley Hartman is a 32 y.o. male who presents today for a complete physical exam. She reports consuming a {diet types:17450} diet. {types:19826} She generally feels {DESC; WELL/FAIRLY WELL/POORLY:18703}. She reports sleeping {DESC; WELL/FAIRLY WELL/POORLY:18703}. She {does/does not:200015} have additional problems to discuss today.    Most recent fall risk assessment:    02/22/2022   10:42 AM  Fall Risk   Falls in the past year? 0  Number falls in past yr: 0  Injury with Fall? 0  Risk for fall due to : No Fall Risks  Follow up Falls evaluation completed     Most recent depression screenings:    02/22/2022   10:42 AM 01/13/2021   10:46 AM  PHQ 2/9 Scores  PHQ - 2 Score 0 0  PHQ- 9 Score 5     {VISON DENTAL STD PSA (Optional):27386}  {History (Optional):23778}  Patient Care Team: Jessup, Joy, NP as PCP - General (Nurse Practitioner)   Outpatient Medications Prior to Visit  Medication Sig   fluticasone (FLONASE) 50 MCG/ACT nasal spray Place 2 sprays into both nostrils in the morning and at bedtime. After 7 days, reduce to once daily.   norgestimate-ethinyl estradiol (SPRINTEC 28) 0.25-35 MG-MCG tablet Take 1 tablet by mouth daily.   Nystatin POWD Apply liberally to affected area 2 times per day   spironolactone (ALDACTONE) 100 MG tablet Take 1 tablet (100 mg total) by mouth daily.   No facility-administered medications prior to visit.    ROS        Objective:     There were no vitals taken for this visit. {Vitals History (Optional):23777}  Physical Exam   No results found for any visits on 03/30/22. {Show previous labs (optional):23779}    Assessment & Plan:    Routine Health Maintenance and Physical Exam  Immunization History  Administered Date(s) Administered   DTaP 08/31/1999, 10/27/1999,  01/05/2000, 09/20/2000, 04/05/2004   Hepatitis A 01/31/2008, 02/05/2009   Hepatitis B 06/18/1999, 07/26/1999, 01/05/2000   HiB (PRP-OMP) 08/31/1999, 10/27/1999, 01/05/2000, 09/20/2000   IPV 08/31/1999, 10/27/1999, 06/25/2000, 04/05/2004   Influenza,inj,Quad PF,6+ Mos 05/08/2014   Influenza-Unspecified 08/07/2012   MMR 06/25/2001, 04/05/2004   Meningococcal Polysaccharide 02/05/2012   Pneumococcal Conjugate-13 09/20/2000   Pneumococcal-Unspecified 01/05/2000, 03/20/2000   Tdap 02/05/2012   Varicella 06/25/2000, 01/31/2008    Health Maintenance  Topic Date Due   HIV Screening  Never done   Hepatitis C Screening  Never done   INFLUENZA VACCINE  03/28/2022   PAP-Cervical Cytology Screening  03/30/2022 (Originally 06/16/2020)   PAP SMEAR-Modifier  03/30/2022 (Originally 06/16/2020)   TETANUS/TDAP  03/30/2022 (Originally 02/04/2022)   HPV VACCINES  Discontinued   COVID-19 Vaccine  Discontinued    Discussed health benefits of physical activity, and encouraged her to engage in regular exercise appropriate for her age and condition.  Problem List Items Addressed This Visit   None Visit Diagnoses     Annual physical exam    -  Primary   Cervical cancer screening       Need for Tdap vaccination          No follow-ups on file.     Joy Jessup, NP   

## 2023-01-29 ENCOUNTER — Other Ambulatory Visit (HOSPITAL_COMMUNITY)
Admission: RE | Admit: 2023-01-29 | Discharge: 2023-01-29 | Disposition: A | Discharge status: Home / Self Care | Payer: 59 | Source: Ambulatory Visit | Attending: Family Medicine | Admitting: Family Medicine

## 2023-01-29 ENCOUNTER — Ambulatory Visit (INDEPENDENT_AMBULATORY_CARE_PROVIDER_SITE_OTHER): Payer: 59 | Admitting: Family Medicine

## 2023-01-29 ENCOUNTER — Encounter: Payer: Self-pay | Admitting: Family Medicine

## 2023-01-29 VITALS — BP 120/87 | HR 75 | Temp 97.8°F | Wt 125.0 lb

## 2023-01-29 DIAGNOSIS — D225 Melanocytic nevi of trunk: Secondary | ICD-10-CM | POA: Diagnosis not present

## 2023-01-29 DIAGNOSIS — D229 Melanocytic nevi, unspecified: Secondary | ICD-10-CM

## 2023-01-29 DIAGNOSIS — L989 Disorder of the skin and subcutaneous tissue, unspecified: Secondary | ICD-10-CM

## 2023-01-29 NOTE — Progress Notes (Signed)
Wesley Hartman , 05-19-1991, 32 y.o., male MRN: [id number] Patient Care Team    Relationship Specialty Notifications Start End  Natalia Leatherwood, DO PCP - General Family Medicine  04/14/22     Chief Complaint  Patient presents with   skin lesion     Subjective: Wesley Hartman is a 32 y.o. Pt presents for an OV with complaints of irritated skin lesion of 2 week duration.  Associated symptoms include bleeding and pain. He reports he has had the skin lesion ever since he can remember. It frequently becomes caught on clothing, becomes tender and sometimes bleeds.  Was seen 2 weeks ago for this condition after he had gotten caught on clothing and it appeared mildly infected and irritated at that time.  Bactroban ointment twice daily was prescribed and he has been using as prescribed. He is present today to have skin lesion removed.  There is no personal or family history of skin cancers.     01/17/2023    1:50 PM 08/03/2022    9:46 AM 05/04/2022   10:15 AM 04/14/2022    3:13 PM  Depression screen PHQ 2/9  Decreased Interest 0 0 1 2  Down, Depressed, Hopeless 1 1 2 2   PHQ - 2 Score 1 1 3 4   Altered sleeping 1 0 2 3  Tired, decreased energy 1 0 2 3  Change in appetite 2 1 1 2   Feeling bad or failure about yourself  1 0 1 3  Trouble concentrating 0 0 0 0  Moving slowly or fidgety/restless 0 0 2 2  Suicidal thoughts 0 0 0 1  PHQ-9 Score 6 2 11 18   Difficult doing work/chores Not difficult at all       Allergies  Allergen Reactions   Doxycycline Diarrhea, Nausea And Vomiting and Rash   Social History   Social History Narrative   Marital status/children/pets: Married   Education/employment: School education.  Employed as a Civil [employer] fast streamer:      -smoke alarm in the home:Yes     - wears seatbelt: Yes     - Feels safe in their relationships: Yes      Past Medical History:  Diagnosis Date   Depression    Faint    Gastritis 09/07/2014   GERD (gastroesophageal reflux  disease)    Irregular heartbeat    benign   Substance abuse in remission [facility])    Past Surgical History:  Procedure Laterality Date   WISDOM TOOTH EXTRACTION     Family History  Problem Relation Age of Onset   Healthy Mother        Living/Living   Arthritis Father    Asthma Father    Bipolar disorder Father    Diabetes Father    Drug abuse Father    Diabetes Sister    Parkinson's disease Maternal Grandmother    COPD Maternal Grandfather    Arthritis Maternal Uncle        Hip Replacement   Allergies as of 01/29/2023       Reactions   Doxycycline Diarrhea, Nausea And Vomiting, Rash        Medication List        Accurate as of January 29, 2023 12:13 PM. If you have any questions, ask your nurse or doctor.          STOP taking these medications    OLANZapine 5 MG tablet Commonly known as: ZYPREXA Stopped by: Luster Landsberg  Redford Behrle, DO       TAKE these medications    FLUoxetine 20 MG tablet Commonly known as: PROZAC Take 3 tablets (60 mg total) by mouth daily.   mupirocin ointment 2 % Commonly known as: BACTROBAN Apply 1 Application topically 2 (two) times daily for 14 days.        All past medical history, surgical history, allergies, family history, immunizations andmedications were updated in the EMR today and reviewed under the history and medication portions of their EMR.     ROS Negative, with the exception of above mentioned in HPI   Objective:  BP 120/87   Pulse 75   Temp 97.8 F (36.6 C)   Wt 125 lb (56.7 kg)   SpO2 98%   BMI 19.58 kg/m  Body mass index is 19.58 kg/m. Physical Exam Vitals and nursing note reviewed.  Constitutional:      General: He is not in acute distress.    Appearance: Normal appearance. He is not ill-appearing, toxic-appearing or diaphoretic.  HENT:     Head: Normocephalic and atraumatic.  Eyes:     General: No scleral icterus.       Right eye: No discharge.        Left eye: No discharge.     Extraocular Movements:  Extraocular movements intact.     Pupils: Pupils are equal, round, and reactive to light.  Skin:    General: Skin is warm and dry.     Coloration: Skin is not jaundiced or pale.     Findings: Lesion (2x2 cm Mildly hyperpigmented irritated thick raised skin lesion of left scapula./back) present. No rash.  Neurological:     Mental Status: He is alert and oriented to person, place, and time. Mental status is at baseline.  Psychiatric:        Mood and Affect: Mood normal.        Behavior: Behavior normal.        Thought Content: Thought content normal.        Judgment: Judgment normal.      No results found. No results found. No results found for this or any previous visit (from the past 24 hour(s)).  Assessment/Plan: [name] is a 32 y.o. male present for OV for  Irritated skin lesion: Irritated thickened raised skin lesion present, no longer appears infected.  Removal discussed today. The skin biopsy was performed on the left side of back. The patient was consented for biopsy. The complications, instructions as to how the procedure will be performed, and postoperative instructions were given to the patient.  Intent: To remove a portion of skin so that it can be examined histologically. Location: Left back/scapular region PROCEDURE: The site was cleaned with antiseptic.  0.25 cc of  local anesthetic (1% lidocaine with lidocaine) injected surrounding site. A shave biopsy was performed with careful attention to obtain both affected tissue and normal tissue .The site was then checked for bleeding. Once hemostasis was achieved, a local antibiotic ointment was placed and a pressure dressing applied. The patient was further instructed to keep the site completely dry for the next 24 hours, after which a new dressing and antibiotic ointment should be applied to the area. They were further instructed to avoid getting the site dirty or infected.  The patient completed the procedure without any  complications and tolerated the the procedure well.  The biopsy will  be sent for analysis.  Patient will be called with results once available.  Reviewed  expectations re: course of current medical issues. Discussed self-management of symptoms. Outlined signs and symptoms indicating need for more acute intervention. Patient verbalized understanding and all questions were answered. Patient received an After-Visit Summary.    No orders of the defined types were placed in this encounter.  No orders of the defined types were placed in this encounter.  Referral Orders  No referral(s) requested today     Note is dictated utilizing voice recognition software. Although note has been proof read prior to signing, occasional typographical errors still can be missed. If any questions arise, please do not hesitate to call for verification.   electronically signed by:  Felix Pacini, DO  [facility] [place]

## 2023-01-29 NOTE — Patient Instructions (Addendum)
Return if symptoms worsen or fail to improve.        Great to see you today.  I have refilled the medication(s) we provide.   If labs were collected, we will inform you of lab results once received either by echart message or telephone call.   - echart message- for normal results that have been seen by the patient already.   - telephone call: abnormal results or if patient has not viewed results in their echart.  

## 2023-02-02 ENCOUNTER — Ambulatory Visit: Payer: 59 | Admitting: Family Medicine

## 2023-02-02 LAB — SURGICAL PATHOLOGY

## 2023-02-06 ENCOUNTER — Encounter: Payer: Self-pay | Admitting: Family Medicine

## 2023-02-06 ENCOUNTER — Ambulatory Visit (INDEPENDENT_AMBULATORY_CARE_PROVIDER_SITE_OTHER): Payer: 59 | Admitting: Family Medicine

## 2023-02-06 VITALS — BP 133/86 | HR 56 | Temp 98.0°F | Wt 129.2 lb

## 2023-02-06 DIAGNOSIS — F330 Major depressive disorder, recurrent, mild: Secondary | ICD-10-CM | POA: Diagnosis not present

## 2023-02-06 DIAGNOSIS — F630 Pathological gambling: Secondary | ICD-10-CM

## 2023-02-06 DIAGNOSIS — F1911 Other psychoactive substance abuse, in remission: Secondary | ICD-10-CM | POA: Diagnosis not present

## 2023-02-06 DIAGNOSIS — F39 Unspecified mood [affective] disorder: Secondary | ICD-10-CM

## 2023-02-06 DIAGNOSIS — G479 Sleep disorder, unspecified: Secondary | ICD-10-CM

## 2023-02-06 MED ORDER — FLUOXETINE HCL 20 MG PO TABS: 60.0000 mg | ORAL_TABLET | Freq: Every day | ORAL | 1 refills | Status: DC

## 2023-02-06 MED ORDER — OLANZAPINE 7.5 MG PO TABS: 7.5000 mg | ORAL_TABLET | Freq: Every day | ORAL | 1 refills | Status: DC

## 2023-02-06 NOTE — Patient Instructions (Signed)
Return in about 24 weeks (around 07/24/2023) for Routine chronic condition follow-up.        Great to see you today.  I have refilled the medication(s) we provide.   If labs were collected, we will inform you of lab results once received either by echart message or telephone call.   - echart message- for normal results that have been seen by the patient already.   - telephone call: abnormal results or if patient has not viewed results in their echart.    

## 2023-02-06 NOTE — Progress Notes (Signed)
Patient ID: Wesley Hartman, male  DOB: 12/04/1990, 32 y.o.   MRN: [id number] Patient Care Team    Relationship Specialty Notifications Start End  Natalia Leatherwood, DO PCP - General Family Medicine  04/14/22     Chief Complaint  Patient presents with   Depression    Requesting change of medication does not feel like it is working    Subjective:  Wesley Hartman is a 32 y.o.  male present for follow-up on his depression/mood disorder.  Patient reports he is still taking the Zyprexa 5 mg nightly, and the Prozac 60 mg daily.  He feels over the last couple of weeks it is not working as well as it has in the past.  He has become more irritable again and is wondering if his dose can be increased or switch to a different medication.  Prior note He reports overall he is feeling well on medications. He is still a little irritable and believes he could use a higher dose on his prozac. He is compliant with prozac 40 mg qd and zyprexa 5 mg qhs. He is healing well from his injuries.  Prior  note: He started the Prozac and Zyprexa 2.5 mg nightly.  His significant other is with him today and provides much of the information since patient has had a rather severe injury recently and is unable to move his jaw comfortably.  He is also unable to hear.  Patient's significant other reports that he was doing fairly well and they could see some improvement with the start of medication.  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  Patient sustained facial fractures, liver laceration, spleen laceration, kidney laceration.  He has been hospitalized and recently discharged with appropriate appointment set up for surgery.  Now that he has had such significant trauma, he does not feel the medications are working as effectively. They do report he is having PTSD-like features, flashbacks and panic surrounding his recent trauma. Prior note: Patient presents with his significant other today to  discuss his depression.  He reports he recently has struggled with opioid addiction.  He did not receive treatment, but was able to come off opioid on his own and has been clean from that standpoint.  He states he has noticed he has worsening depression.  He has feelings of guilt surrounding what he put himself and his family through.  He has a family history of manic depressive/bipolar in his father. Wesley Hartman has never been on medications in the past for mental health.  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  His girlfriend states he can be very impulsive, spending entire checks gambling.     02/06/2023   11:04 AM 01/17/2023    1:50 PM 08/03/2022    9:46 AM 05/04/2022   10:15 AM 04/14/2022    3:13 PM  Depression screen PHQ 2/9  Decreased Interest 2 0 0 1 2  Down, Depressed, Hopeless 2 1 1 2 2   PHQ - 2 Score 4 1 1 3 4   Altered sleeping 1 1 0 2 3  Tired, decreased energy 1 1 0 2 3  Change in appetite 2 2 1 1 2   Feeling bad or failure about yourself  1 1 0 1 3  Trouble concentrating 0 0 0 0 0  Moving slowly or fidgety/restless 0 0 0 2 2  Suicidal thoughts 0 0 0 0 1  PHQ-9 Score 9 6 2  11  18  Difficult doing work/chores  Not difficult at all         02/06/2023   11:05 AM 01/17/2023    1:50 PM 08/03/2022    9:46 AM 05/04/2022   10:16 AM  GAD 7 : Generalized Anxiety Score  Nervous, Anxious, on Edge 1 1 0 1  Control/stop worrying 1 1 0 2  Worry too much - different things 2 2 1 2   Trouble relaxing 1 1 0 2  Restless 1 1 0 2  Easily annoyed or irritable 2 1 0 2  Afraid - awful might happen 2 1 0 1  Total GAD 7 Score 10 8 1 12   Anxiety Difficulty  Not difficult at all             02/06/2023   11:04 AM 01/17/2023    1:50 PM  Fall Risk   Falls in the past year? 0 0  Number falls in past yr: 0   Injury with Fall? 0 0  Follow up Falls evaluation completed      Immunization History  Administered Date(s) Administered   Tdap 03/02/2018   No results found.  Past Medical  History:  Diagnosis Date   Depression    Faint    Gastritis 09/07/2014   GERD (gastroesophageal reflux disease)    Irregular heartbeat    benign   Substance abuse in remission (HCC)    Allergies  Allergen Reactions   Doxycycline Diarrhea, Nausea And Vomiting and Rash   Past Surgical History:  Procedure Laterality Date   WISDOM TOOTH EXTRACTION     Family History  Problem Relation Age of Onset   Healthy Mother        Living/Living   Arthritis Father    Asthma Father    Bipolar disorder Father    Diabetes Father    Drug abuse Father    Diabetes Sister    Parkinson's disease Maternal Grandmother    COPD Maternal Grandfather    Arthritis Maternal Uncle        Hip Replacement   Social History   Social History Narrative   Marital status/children/pets: Married   Education/employment: School education.  Employed as a Civil [employer] fast streamer:      -smoke alarm in the home:Yes     - wears seatbelt: Yes     - Feels safe in their relationships: Yes       Allergies as of 02/06/2023       Reactions   Doxycycline Diarrhea, Nausea And Vomiting, Rash        Medication List        Accurate as of February 06, 2023 12:25 PM. If you have any questions, ask your nurse or doctor.          FLUoxetine 20 MG tablet Commonly known as: PROZAC Take 3 tablets (60 mg total) by mouth daily.   OLANZapine 7.5 MG tablet Commonly known as: ZYPREXA Take 1 tablet (7.5 mg total) by mouth at bedtime. Started by: Felix Pacini, DO        All past medical history, surgical history, allergies, family history, immunizations andmedications were updated in the EMR today and reviewed under the history and medication portions of their EMR.       No results found.   ROS 14 pt review of systems performed and negative (unless mentioned in an HPI)  Objective: BP 133/86   Pulse (!) 56   Temp 98 F (36.7 C)   Wt 129 lb  3.2 oz (58.6 kg)   SpO2 98%   BMI 20.24 kg/m  Physical Exam Vitals  and nursing note reviewed.  Constitutional:      General: He is not in acute distress.    Appearance: Normal appearance. He is not ill-appearing, toxic-appearing or diaphoretic.  HENT:     Head: Normocephalic and atraumatic.  Eyes:     General: No scleral icterus.       Right eye: No discharge.        Left eye: No discharge.     Extraocular Movements: Extraocular movements intact.     Pupils: Pupils are equal, round, and reactive to light.  Skin:    General: Skin is warm and dry.     Coloration: Skin is not jaundiced or pale.     Findings: No rash.  Neurological:     Mental Status: He is alert and oriented to person, place, and time. Mental status is at baseline.  Psychiatric:        Attention and Perception: Attention and perception normal.        Mood and Affect: Mood normal.        Behavior: Behavior normal.        Thought Content: Thought content normal.        Cognition and Memory: Cognition and memory normal.        Judgment: Judgment normal.     Assessment/plan: KALA GASSMANN is a 32 y.o. male present for  Major depressive disorder, recurrent episode, mild (HCC)/PTSD/ Mood disorder (HCC)/gambling disorder/sleep disorder Could use more coverage Continue fluoxetine 60 mg daily Referral to to psychology  placed in the past Increase to zyprexa 7.5, if not covered, increase to zyprexa 10 mg.   Return in about 24 weeks (around 07/24/2023) for Routine chronic condition follow-up.  No orders of the defined types were placed in this encounter.  Meds ordered this encounter  Medications   FLUoxetine (PROZAC) 20 MG tablet    Sig: Take 3 tablets (60 mg total) by mouth daily.    Dispense:  270 tablet    Refill:  1   OLANZapine (ZYPREXA) 7.5 MG tablet    Sig: Take 1 tablet (7.5 mg total) by mouth at bedtime.    Dispense:  90 tablet    Refill:  1   Referral Orders  No referral(s) requested today      Note is dictated utilizing voice recognition software. Although  note has been proof read prior to signing, occasional typographical errors still can be missed. If any questions arise, please do not hesitate to call for verification.  Electronically signed by: Felix Pacini, DO [facility] [place]

## 2023-02-14 ENCOUNTER — Encounter: Payer: Self-pay | Admitting: Family Medicine

## 2023-06-27 ENCOUNTER — Encounter: Payer: Self-pay | Admitting: Family Medicine

## 2023-06-27 ENCOUNTER — Ambulatory Visit (INDEPENDENT_AMBULATORY_CARE_PROVIDER_SITE_OTHER): Payer: 59 | Admitting: Family Medicine

## 2023-06-27 VITALS — BP 120/74 | HR 62 | Temp 97.9°F | Wt 126.2 lb

## 2023-06-27 DIAGNOSIS — K2900 Acute gastritis without bleeding: Secondary | ICD-10-CM

## 2023-06-27 DIAGNOSIS — K219 Gastro-esophageal reflux disease without esophagitis: Secondary | ICD-10-CM

## 2023-06-27 MED ORDER — OMEPRAZOLE 40 MG PO CPDR: 40.0000 mg | DELAYED_RELEASE_CAPSULE | Freq: Every day | ORAL | 1 refills | Status: DC

## 2023-06-27 MED ORDER — SUCRALFATE 1 G PO TABS: 1.0000 g | ORAL_TABLET | Freq: Three times a day (TID) | ORAL | 0 refills | Status: DC

## 2023-06-27 NOTE — Progress Notes (Unsigned)
Wesley Hartman , 1990-11-17, 32 y.o., male MRN: [id number] Patient Care Team    Relationship Specialty Notifications Start End  Natalia Leatherwood, DO PCP - General Family Medicine  04/14/22     Chief Complaint  Patient presents with   Abdominal Pain    Onset 2 days; pt also c/o dry skin, currently using medicated cleanser     Subjective: Wesley Hartman is a 32 y.o. Pt presents for an OV with complaints of epigastric pain of 2 days duration.  Associated symptoms include occasional nausea.  Patient reports he had similar symptoms in the past and he had stomach ulcers. He reports he does consume spicy foods frequently. Denies fevers, chills, vomiting, diarrhea, constipation. Pt has tried nothing to ease their symptoms.      06/27/2023   10:35 AM 02/06/2023   11:04 AM 01/17/2023    1:50 PM 08/03/2022    9:46 AM 05/04/2022   10:15 AM  Depression screen PHQ 2/9  Decreased Interest 0 2 0 0 1  Down, Depressed, Hopeless 0 2 1 1 2   PHQ - 2 Score 0 4 1 1 3   Altered sleeping 0 1 1 0 2  Tired, decreased energy 0 1 1 0 2  Change in appetite 0 2 2 1 1   Feeling bad or failure about yourself  0 1 1 0 1  Trouble concentrating 0 0 0 0 0  Moving slowly or fidgety/restless 0 0 0 0 2  Suicidal thoughts 0 0 0 0 0  PHQ-9 Score 0 9 6 2 11   Difficult doing work/chores Not difficult at all  Not difficult at all      Allergies  Allergen Reactions   Doxycycline Diarrhea, Nausea And Vomiting and Rash   Social History   Social History Narrative   Marital status/children/pets: Married   Education/employment: School education.  Employed as a [employer] fast streamer:      -smoke alarm in the home:Yes     - wears seatbelt: Yes     - Feels safe in their relationships: Yes      Past Medical History:  Diagnosis Date   Depression    Faint    Gastritis 09/07/2014   GERD (gastroesophageal reflux disease)    Irregular heartbeat    benign   Substance abuse in remission [facility])    Past Surgical  History:  Procedure Laterality Date   WISDOM TOOTH EXTRACTION     Family History  Problem Relation Age of Onset   Healthy Mother        Living/Living   Arthritis Father    Asthma Father    Bipolar disorder Father    Diabetes Father    Drug abuse Father    Diabetes Sister    Parkinson's disease Maternal Grandmother    COPD Maternal Grandfather    Arthritis Maternal Uncle        Hip Replacement   Allergies as of 06/27/2023       Reactions   Doxycycline Diarrhea, Nausea And Vomiting, Rash        Medication List        Accurate as of June 27, 2023 11:59 PM. If you have any questions, ask your nurse or doctor.          FLUoxetine 20 MG tablet Commonly known as: PROZAC Take 3 tablets (60 mg total) by mouth daily.   OLANZapine 7.5 MG tablet Commonly known as: ZYPREXA Take 1 tablet (7.5 mg  total) by mouth at bedtime.   omeprazole 40 MG capsule Commonly known as: PRILOSEC Take 1 capsule (40 mg total) by mouth daily. Started by: Felix Pacini   sucralfate 1 g tablet Commonly known as: Carafate Take 1 tablet (1 g total) by mouth 4 (four) times daily -  with meals and at bedtime. Started by: Felix Pacini        All past medical history, surgical history, allergies, family history, immunizations andmedications were updated in the EMR today and reviewed under the history and medication portions of their EMR.     ROS Negative, with the exception of above mentioned in HPI   Objective:  BP 120/74   Pulse 62   Temp 97.9 F (36.6 C)   Wt 126 lb 3.2 oz (57.2 kg)   SpO2 98%   BMI 19.77 kg/m  Body mass index is 19.77 kg/m. Physical Exam Vitals and nursing note reviewed.  Constitutional:      General: He is not in acute distress.    Appearance: Normal appearance. He is not ill-appearing, toxic-appearing or diaphoretic.  HENT:     Head: Normocephalic and atraumatic.  Eyes:     General: No scleral icterus.       Right eye: No discharge.        Left eye:  No discharge.     Extraocular Movements: Extraocular movements intact.     Pupils: Pupils are equal, round, and reactive to light.  Abdominal:     General: Abdomen is flat. Bowel sounds are normal. There is no distension.     Palpations: Abdomen is soft. There is no mass.     Tenderness: There is abdominal tenderness (Mild epigastric discomfort). There is no guarding or rebound.     Hernia: No hernia is present.  Skin:    General: Skin is warm and dry.     Coloration: Skin is not jaundiced or pale.     Findings: No rash.  Neurological:     Mental Status: He is alert and oriented to person, place, and time. Mental status is at baseline.  Psychiatric:        Mood and Affect: Mood normal.        Behavior: Behavior normal.        Thought Content: Thought content normal.        Judgment: Judgment normal.    No results found. No results found. No results found for this or any previous visit (from the past 24 hour(s)).  Assessment/Plan: Wesley Hartman is a 32 y.o. male present for OV for  Other acute gastritis, presence of bleeding unspecified/Gastroesophageal reflux disease without esophagitis GERD diet discussed. Bland diet next few days encouraged.  Avoid spicy foods.  Avoid NSAIDs and alcohol. Start omeprazole daily Start Carafate as prescribed Follow-up around 6 weeks for his chronic conditions and we will follow-up on this problem as well.  Reviewed expectations re: course of current medical issues. Discussed self-management of symptoms. Outlined signs and symptoms indicating need for more acute intervention. Patient verbalized understanding and all questions were answered. Patient received an After-Visit Summary.    No orders of the defined types were placed in this encounter.  Meds ordered this encounter  Medications   omeprazole (PRILOSEC) 40 MG capsule    Sig: Take 1 capsule (40 mg total) by mouth daily.    Dispense:  90 capsule    Refill:  1   sucralfate  (CARAFATE) 1 g tablet    Sig: Take 1  tablet (1 g total) by mouth 4 (four) times daily -  with meals and at bedtime.    Dispense:  120 tablet    Refill:  0   Referral Orders  No referral(s) requested today     Note is dictated utilizing voice recognition software. Although note has been proof read prior to signing, occasional typographical errors still can be missed. If any questions arise, please do not hesitate to call for verification.   electronically signed by:  Felix Pacini, DO  [facility] [place]

## 2023-06-27 NOTE — Patient Instructions (Addendum)
Return in about 5 weeks (around 08/01/2023) for Routine chronic condition follow-up.  Carafate before meals and before bed for 1 month Omeprazole daily on empty stomach for 3 months.        Great to see you today.  I have refilled the medication(s) we provide.   If labs were collected or images ordered, we will inform you of  results once we have received them and reviewed. We will contact you either by echart message, or telephone call.  Please give ample time to the testing facility, and our office to run,  receive and review results. Please do not call inquiring of results, even if you can see them in your chart. We will contact you as soon as we are able. If it has been over 1 week since the test was completed, and you have not yet heard from [place], then please call us.    - echart message- for normal results that have been seen by the patient already.   - telephone call: abnormal results or if patient has not viewed results in their echart.  If a referral to a specialist was entered for you, please call us in 2 weeks if you have not heard from the specialist office to schedule.

## 2023-08-01 ENCOUNTER — Telehealth (INDEPENDENT_AMBULATORY_CARE_PROVIDER_SITE_OTHER): Payer: 59 | Admitting: Family Medicine

## 2023-08-01 ENCOUNTER — Encounter: Payer: Self-pay | Admitting: Family Medicine

## 2023-08-01 DIAGNOSIS — F39 Unspecified mood [affective] disorder: Secondary | ICD-10-CM | POA: Diagnosis not present

## 2023-08-01 DIAGNOSIS — F330 Major depressive disorder, recurrent, mild: Secondary | ICD-10-CM

## 2023-08-01 DIAGNOSIS — G479 Sleep disorder, unspecified: Secondary | ICD-10-CM | POA: Diagnosis not present

## 2023-08-01 MED ORDER — OLANZAPINE 7.5 MG PO TABS: 7.5000 mg | ORAL_TABLET | Freq: Every day | ORAL | 1 refills | Status: AC

## 2023-08-01 MED ORDER — FLUOXETINE HCL 20 MG PO TABS: 60.0000 mg | ORAL_TABLET | Freq: Every day | ORAL | 1 refills | Status: DC

## 2023-08-01 NOTE — Patient Instructions (Signed)
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
## 2024-02-05 ENCOUNTER — Ambulatory Visit: Admitting: Family Medicine

## 2024-02-05 ENCOUNTER — Ambulatory Visit (INDEPENDENT_AMBULATORY_CARE_PROVIDER_SITE_OTHER): Admitting: Family Medicine

## 2024-02-05 ENCOUNTER — Encounter: Payer: Self-pay | Admitting: Family Medicine

## 2024-02-05 VITALS — BP 116/74 | HR 70 | Temp 97.9°F | Wt 126.0 lb

## 2024-02-05 DIAGNOSIS — F39 Unspecified mood [affective] disorder: Secondary | ICD-10-CM

## 2024-02-05 DIAGNOSIS — F1911 Other psychoactive substance abuse, in remission: Secondary | ICD-10-CM

## 2024-02-05 DIAGNOSIS — G479 Sleep disorder, unspecified: Secondary | ICD-10-CM

## 2024-02-05 DIAGNOSIS — F330 Major depressive disorder, recurrent, mild: Secondary | ICD-10-CM | POA: Diagnosis not present

## 2024-02-05 MED ORDER — FLUOXETINE HCL 20 MG PO TABS: 60.0000 mg | ORAL_TABLET | Freq: Every day | ORAL | 1 refills | Status: AC

## 2024-02-05 MED ORDER — QUETIAPINE FUMARATE 100 MG PO TABS: 100.0000 mg | ORAL_TABLET | Freq: Every day | ORAL | 0 refills | Status: AC

## 2024-02-05 NOTE — Patient Instructions (Addendum)
 Return in about 2 weeks (around 02/19/2024).  1/2 tab zyprexa  and 1/2 tab Seroquel before bed for 1 week. Then 1 tab seroquel before bed and no zyprexa . On follow up we will taper up on the medication if needed.       Great to see you today.  I have refilled the medication(s) we provide.   If labs were collected or images ordered, we will inform you of  results once we have received them and reviewed. We will contact you either by echart message, or telephone call.  Please give ample time to the testing facility, and our office to run,  receive and review results. Please do not call inquiring of results, even if you can see them in your chart. We will contact you as soon as we are able. If it has been over 1 week since the test was completed, and you have not yet heard from us , then please call us .    - echart message- for normal results that have been seen by the patient already.   - telephone call: abnormal results or if patient has not viewed results in their echart.  If a referral to a specialist was entered for you, please call us  in 2 weeks if you have not heard from the specialist office to schedule.
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
## 2024-02-12 DIAGNOSIS — F1120 Opioid dependence, uncomplicated: Secondary | ICD-10-CM | POA: Diagnosis not present

## 2024-02-13 DIAGNOSIS — F1120 Opioid dependence, uncomplicated: Secondary | ICD-10-CM | POA: Diagnosis not present

## 2024-02-15 DIAGNOSIS — F1120 Opioid dependence, uncomplicated: Secondary | ICD-10-CM | POA: Diagnosis not present

## 2024-02-18 DIAGNOSIS — F1120 Opioid dependence, uncomplicated: Secondary | ICD-10-CM | POA: Diagnosis not present

## 2024-02-19 ENCOUNTER — Ambulatory Visit: Admitting: Family Medicine

## 2024-02-19 DIAGNOSIS — F1120 Opioid dependence, uncomplicated: Secondary | ICD-10-CM | POA: Diagnosis not present

## 2024-02-20 DIAGNOSIS — F1120 Opioid dependence, uncomplicated: Secondary | ICD-10-CM | POA: Diagnosis not present

## 2024-02-22 DIAGNOSIS — F1120 Opioid dependence, uncomplicated: Secondary | ICD-10-CM | POA: Diagnosis not present

## 2024-02-25 DIAGNOSIS — F1120 Opioid dependence, uncomplicated: Secondary | ICD-10-CM | POA: Diagnosis not present

## 2024-02-26 DIAGNOSIS — F1120 Opioid dependence, uncomplicated: Secondary | ICD-10-CM | POA: Diagnosis not present

## 2024-02-27 DIAGNOSIS — F1120 Opioid dependence, uncomplicated: Secondary | ICD-10-CM | POA: Diagnosis not present

## 2024-03-02 ENCOUNTER — Other Ambulatory Visit: Payer: Self-pay | Admitting: Family Medicine

## 2024-03-03 DIAGNOSIS — F1120 Opioid dependence, uncomplicated: Secondary | ICD-10-CM | POA: Diagnosis not present

## 2024-03-04 DIAGNOSIS — F1120 Opioid dependence, uncomplicated: Secondary | ICD-10-CM | POA: Diagnosis not present

## 2024-03-05 DIAGNOSIS — F1120 Opioid dependence, uncomplicated: Secondary | ICD-10-CM | POA: Diagnosis not present

## 2024-03-07 DIAGNOSIS — F1120 Opioid dependence, uncomplicated: Secondary | ICD-10-CM | POA: Diagnosis not present

## 2024-03-10 DIAGNOSIS — F1120 Opioid dependence, uncomplicated: Secondary | ICD-10-CM | POA: Diagnosis not present

## 2024-04-03 ENCOUNTER — Telehealth: Payer: Self-pay | Admitting: Family Medicine

## 2024-04-03 NOTE — Telephone Encounter (Signed)
 Copied from CRM [id number]. Topic: Referral - Question >> Apr 03, 2024  3:27 PM Jayma L wrote: Reason for CRM:    amanda called with patients work comp asking if we can send a copy of the referral to Dr. Elsie Ny with Ortho for a second opinion on disability rating. Patient is already scheduled for Wednesday aug 13th at 2pm. The correct fax number to send too is [phone]

## 2024-04-04 NOTE — Telephone Encounter (Signed)
LVM to discuss with pt.

## 2024-04-08 NOTE — Telephone Encounter (Signed)
 Reason for CRM: alan called with patients work comp asking if we can send a copy of the referral to Dr. Elsie Ny with Ortho for a second opinion on disability rating. Patient is already scheduled for Wednesday aug 13th at 2pm. The correct fax number to send too is [phone]   LVM for pt to discuss.

## 2024-04-30 DIAGNOSIS — F4312 Post-traumatic stress disorder, chronic: Secondary | ICD-10-CM | POA: Diagnosis not present

## 2024-05-12 DIAGNOSIS — F4312 Post-traumatic stress disorder, chronic: Secondary | ICD-10-CM | POA: Diagnosis not present

## 2024-05-26 DIAGNOSIS — F4312 Post-traumatic stress disorder, chronic: Secondary | ICD-10-CM | POA: Diagnosis not present

## 2024-06-23 DIAGNOSIS — F4312 Post-traumatic stress disorder, chronic: Secondary | ICD-10-CM | POA: Diagnosis not present

## 2024-07-07 DIAGNOSIS — F4312 Post-traumatic stress disorder, chronic: Secondary | ICD-10-CM | POA: Diagnosis not present

## 2024-07-28 ENCOUNTER — Ambulatory Visit: Admitting: Sports Medicine

## 2024-07-28 ENCOUNTER — Ambulatory Visit

## 2024-07-28 VITALS — BP 118/84 | HR 102 | Wt 125.0 lb

## 2024-07-28 DIAGNOSIS — M25362 Other instability, left knee: Secondary | ICD-10-CM

## 2024-07-28 DIAGNOSIS — M25562 Pain in left knee: Secondary | ICD-10-CM

## 2024-07-28 DIAGNOSIS — G8929 Other chronic pain: Secondary | ICD-10-CM

## 2024-07-28 MED ORDER — MELOXICAM 15 MG PO TABS: 15.0000 mg | ORAL_TABLET | Freq: Every day | ORAL | 0 refills | Status: AC

## 2024-07-28 NOTE — Patient Instructions (Signed)
 Good to see you   - Start meloxicam 15 mg daily x2 weeks.  If still having pain after 2 weeks, complete 3rd-week of NSAID. May use remaining NSAID as needed once daily for pain control.  Do not to use additional over-the-counter NSAIDs (ibuprofen , naproxen , Advil , Aleve , etc.) while taking prescription NSAIDs.  May use Tylenol  [phone] mg 2 to 3 times a day for breakthrough pain.  MRI of left knee ordered for [facility] they will call you to schedule if you have not heard from them in a week call them at [phone].  Exercises given for knee  Call to schedule an appointment 1 week after getting MRI
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
## 2024-08-03 ENCOUNTER — Other Ambulatory Visit

## 2024-08-03 DIAGNOSIS — M7122 Synovial cyst of popliteal space [Baker], left knee: Secondary | ICD-10-CM | POA: Diagnosis not present

## 2024-08-03 DIAGNOSIS — M25362 Other instability, left knee: Secondary | ICD-10-CM

## 2024-08-03 DIAGNOSIS — G8929 Other chronic pain: Secondary | ICD-10-CM

## 2024-08-03 DIAGNOSIS — M25562 Pain in left knee: Secondary | ICD-10-CM | POA: Diagnosis not present

## 2024-08-04 ENCOUNTER — Ambulatory Visit: Payer: Self-pay | Admitting: Sports Medicine

## 2024-08-04 ENCOUNTER — Telehealth: Payer: Self-pay | Admitting: Sports Medicine

## 2024-08-04 DIAGNOSIS — F4312 Post-traumatic stress disorder, chronic: Secondary | ICD-10-CM | POA: Diagnosis not present

## 2024-08-04 NOTE — Telephone Encounter (Signed)
 Left message for patient to call back to schedule.  MRI has been ready so okay to schedule anytime.

## 2024-08-04 NOTE — Telephone Encounter (Signed)
 Patient's wife called to let Dr Leonce know that he had his MRI yesterday.  Results are still waiting to be read.
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
## 2024-09-01 ENCOUNTER — Ambulatory Visit: Admitting: Sports Medicine

## 2024-09-02 ENCOUNTER — Ambulatory Visit: Admitting: Sports Medicine
